# Patient Record
Sex: Female | Born: 1975
Health system: Southern US, Community
[De-identification: ages and names within clinical notes are randomized; demographics above are authoritative.]

## PROBLEM LIST (undated history)

## (undated) DIAGNOSIS — I10 Essential (primary) hypertension: Secondary | ICD-10-CM

## (undated) DIAGNOSIS — F329 Major depressive disorder, single episode, unspecified: Secondary | ICD-10-CM

## (undated) DIAGNOSIS — J4530 Mild persistent asthma, uncomplicated: Secondary | ICD-10-CM

## (undated) DIAGNOSIS — F32A Depression, unspecified: Secondary | ICD-10-CM

## (undated) DIAGNOSIS — F419 Anxiety disorder, unspecified: Secondary | ICD-10-CM

## (undated) DIAGNOSIS — Z8742 Personal history of other diseases of the female genital tract: Secondary | ICD-10-CM

## (undated) DIAGNOSIS — J309 Allergic rhinitis, unspecified: Secondary | ICD-10-CM

## (undated) HISTORY — DX: Allergic rhinitis, unspecified: J30.9

## (undated) HISTORY — PX: TONSILLECTOMY: SUR1361

## (undated) HISTORY — DX: Personal history of other diseases of the female genital tract: Z87.42

## (undated) HISTORY — PX: CERVICAL BIOPSY  W/ LOOP ELECTRODE EXCISION: SUR135

## (undated) HISTORY — DX: Mild persistent asthma, uncomplicated: J45.30

## (undated) HISTORY — DX: Depression, unspecified: F32.A

## (undated) HISTORY — PX: BREAST BIOPSY: SHX20

---
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
## 2009-12-06 DIAGNOSIS — F419 Anxiety disorder, unspecified: Secondary | ICD-10-CM | POA: Insufficient documentation

## 2009-12-06 DIAGNOSIS — F32A Depression, unspecified: Secondary | ICD-10-CM | POA: Insufficient documentation

## 2010-09-18 DIAGNOSIS — J4530 Mild persistent asthma, uncomplicated: Secondary | ICD-10-CM | POA: Insufficient documentation

## 2017-10-27 ENCOUNTER — Ambulatory Visit (HOSPITAL_COMMUNITY)
Admission: EM | Admit: 2017-10-27 | Discharge: 2017-10-27 | Disposition: A | Discharge status: Home / Self Care | Payer: Self-pay | Attending: Family Medicine | Admitting: Family Medicine

## 2017-10-27 ENCOUNTER — Encounter (HOSPITAL_COMMUNITY): Payer: Self-pay

## 2017-10-27 DIAGNOSIS — Z760 Encounter for issue of repeat prescription: Secondary | ICD-10-CM

## 2017-10-27 DIAGNOSIS — I10 Essential (primary) hypertension: Secondary | ICD-10-CM

## 2017-10-27 HISTORY — DX: Essential (primary) hypertension: I10

## 2017-10-27 HISTORY — DX: Anxiety disorder, unspecified: F41.9

## 2017-10-27 MED ORDER — HYDROCHLOROTHIAZIDE 25 MG PO TABS: 25.0000 mg | ORAL_TABLET | Freq: Every day | ORAL | 0 refills | Status: DC

## 2017-10-27 MED ORDER — AMLODIPINE BESYLATE 10 MG PO TABS: 10.0000 mg | ORAL_TABLET | Freq: Every day | ORAL | 0 refills | Status: DC

## 2017-10-27 NOTE — Discharge Instructions (Signed)
Follow up with new PCP.

## 2017-10-27 NOTE — ED Triage Notes (Signed)
Pt presents with need for refill on HCTZ 25 mg. Reports being out for two days. Reports having a mild headache. Pt is also on Amlodipine but is not out of that and has been talking it.

## 2017-10-27 NOTE — ED Provider Notes (Signed)
[facility]    CSN: [id number] Arrival date & time: 10/27/17  1139     History   Chief Complaint Chief Complaint  Patient presents with  . Medication Refill    HPI Sharon Krysiak is a 42 y.o. female.   HPI  Patient is in between jobs and in between primary care providers.  She is on amlodipine 10 mg a day and hydrochlorothiazide 25 mg a day for her blood pressure.  Her hydrochlorothiazide has run out and she needs a refill of this until she can get in with a new PCP.  Past Medical History:  Diagnosis Date  . Anxiety   . Hypertension     There are no active problems to display for this patient.   History reviewed. No pertinent surgical history.  OB History   None      Home Medications    Prior to Admission medications   Medication Sig Start Date End Date Taking? Authorizing Provider  amLODipine (NORVASC) 10 MG tablet Take 1 tablet (10 mg total) by mouth daily. 10/27/17   Raylene Everts, MD  hydrochlorothiazide (HYDRODIURIL) 25 MG tablet Take 1 tablet (25 mg total) by mouth daily. 10/27/17   Raylene Everts, MD    Family History Family History  Problem Relation Age of Onset  . Hypertension Mother   . Hypertension Father     Social History Social History   Tobacco Use  . Smoking status: Never Smoker  . Smokeless tobacco: Never Used  Substance Use Topics  . Alcohol use: Yes    Frequency: Never    Comment: occ  . Drug use: Never     Allergies   Patient has no known allergies.   Review of Systems Review of Systems  Constitutional: Negative for chills and fever.  HENT: Negative for ear pain and sore throat.   Eyes: Negative for pain and visual disturbance.  Respiratory: Negative for cough and shortness of breath.   Cardiovascular: Negative for chest pain and palpitations.  Gastrointestinal: Negative for abdominal pain and vomiting.  Genitourinary: Negative for dysuria and hematuria.  Musculoskeletal: Negative for arthralgias and  back pain.  Skin: Negative for color change and rash.  Neurological: Negative for seizures and syncope.  Psychiatric/Behavioral: Negative for dysphoric mood. The patient is not nervous/anxious.   All other systems reviewed and are negative.    Physical Exam Triage Vital Signs ED Triage Vitals  Enc Vitals Group     BP 10/27/17 1215 (!) 129/93     Pulse Rate 10/27/17 1215 95     Resp 10/27/17 1215 16     Temp 10/27/17 1215 98.7 F (37.1 C)     Temp Source 10/27/17 1215 Oral     SpO2 10/27/17 1215 96 %     Weight --      Height --      Head Circumference --      Peak Flow --      Pain Score 10/27/17 1217 2     Pain Loc --      Pain Edu? --      Excl. in [place]? --    No data found.  Updated Vital Signs BP (!) 129/93 (BP Location: Left Arm)   Pulse 95   Temp 98.7 F (37.1 C) (Oral)   Resp 16   LMP 10/27/2017   SpO2 96%       Physical Exam  Constitutional: She appears well-developed and well-nourished. No distress.  HENT:  Head: Normocephalic  and atraumatic.  Mouth/Throat: Oropharynx is clear and moist.  Eyes: Pupils are equal, round, and reactive to light. Conjunctivae are normal.  Neck: Normal range of motion.  Cardiovascular: Normal rate, regular rhythm and normal heart sounds.  Pulmonary/Chest: Effort normal and breath sounds normal. No respiratory distress.  Abdominal: Soft. She exhibits no distension.  Musculoskeletal: Normal range of motion. She exhibits no edema.  Neurological: She is alert.  Skin: Skin is warm and dry.  Psychiatric: She has a normal mood and affect. Her behavior is normal.     UC Treatments / Results  Labs (all labs ordered are listed, but only abnormal results are displayed) Labs Reviewed - No data to display  EKG None  Radiology No results found.  Procedures Procedures (including critical care time)  Medications Ordered in UC Medications - No data to display  Initial Impression / Assessment and Plan / UC Course  I have  reviewed the triage vital signs and the nursing notes.  Pertinent labs & imaging results that were available during my care of the patient were reviewed by me and considered in my medical decision making (see chart for details).      Final Clinical Impressions(s) / UC Diagnoses   Final diagnoses:  Medication refill  Essential hypertension     Discharge Instructions     Follow up with new PCP   ED Prescriptions    Medication Sig Dispense Auth. Provider   amLODipine (NORVASC) 10 MG tablet Take 1 tablet (10 mg total) by mouth daily. 90 tablet Raylene Everts, MD   hydrochlorothiazide (HYDRODIURIL) 25 MG tablet Take 1 tablet (25 mg total) by mouth daily. 90 tablet Raylene Everts, MD     Controlled Substance Prescriptions [place] Controlled Substance Registry consulted? Not Applicable   Raylene Everts, MD 10/27/17 1246

## 2018-08-17 ENCOUNTER — Encounter: Payer: Self-pay | Admitting: Family Medicine

## 2018-08-19 ENCOUNTER — Other Ambulatory Visit: Payer: Self-pay

## 2018-08-19 ENCOUNTER — Encounter: Payer: Self-pay | Admitting: Family Medicine

## 2018-08-19 ENCOUNTER — Ambulatory Visit (INDEPENDENT_AMBULATORY_CARE_PROVIDER_SITE_OTHER): Payer: No Typology Code available for payment source | Admitting: Family Medicine

## 2018-08-19 DIAGNOSIS — F321 Major depressive disorder, single episode, moderate: Secondary | ICD-10-CM | POA: Diagnosis not present

## 2018-08-19 DIAGNOSIS — I10 Essential (primary) hypertension: Secondary | ICD-10-CM | POA: Diagnosis not present

## 2018-08-19 DIAGNOSIS — N6310 Unspecified lump in the right breast, unspecified quadrant: Secondary | ICD-10-CM

## 2018-08-19 DIAGNOSIS — Z803 Family history of malignant neoplasm of breast: Secondary | ICD-10-CM

## 2018-08-19 MED ORDER — FLUOXETINE HCL 40 MG PO CAPS: 40.0000 mg | ORAL_CAPSULE | Freq: Every day | ORAL | 3 refills | Status: DC

## 2018-08-19 MED ORDER — AMLODIPINE BESYLATE 10 MG PO TABS: 10.0000 mg | ORAL_TABLET | Freq: Every day | ORAL | 0 refills | Status: DC

## 2018-08-19 MED ORDER — HYDROCHLOROTHIAZIDE 25 MG PO TABS: 25.0000 mg | ORAL_TABLET | Freq: Every day | ORAL | 0 refills | Status: DC

## 2018-08-19 NOTE — Progress Notes (Deleted)
Called patient to initiate their telephone visit with provider Molli Barrows, FNP-C. Verified date of birth. ***. Laurance Flatten, CMA.

## 2018-08-19 NOTE — Progress Notes (Signed)
Virtual Visit via Telephone Note  I connected with Natalie Guzman on 08/19/18 at  4:10 PM EDT by telephone and verified that I am speaking with the correct person using two identifiers.  Location: Patient: Located at home during today's encounter   Provider: Located at primary care office    I discussed the limitations, risks, security and privacy concerns of performing an evaluation and management service by telephone and the availability of in person appointments. I also discussed with the patient that there may be a patient responsible charge related to this service. The patient expressed understanding and agreed to proceed.   History of Present Illness: Natalie Guzman is establishing care today. Medical history includes history of a right benign breast lump, family history of breast cancer, and hypertension.  Patient reports over a year ago being diagnosed with hypertension.  Her blood pressure readings were.  Time remained difficult to control however her previous provider started her on amlodipine and titrated the dose and eventually added hydrochlorothiazide.  She reports monitoring her blood pressure routinely in of recent her blood pressure numbers have consistently ranged in the 025K systolically and 27C diastolically.  She has no history of any other underlying heart conditions.  Last wellness exam was August 2019.  She endorses routine physical activity.  Denies shortness of breath, swelling, chest pain, or headaches.  She is a non-smoker.  She is requesting medication refills today.  She is also requesting a refill of previously prescribed prescription for Prozac 40 mg.  She endorses recent stressors causing various mood swings.  Current stressors include work and routine stresses associated with life.Denies suicidal ideations, homicidal ideations, or auditory hallucinations.  She is also requesting a diagnostic mammogram as she reports she has a lump of her right breast that has been  chronically monitored over the last few years.  She attempted to schedule a diagnostic mammogram however was unable to.  She had her last screening mammogram last year and reports everything was normal.  She has a family history maternal and paternal side of the family of breast cancer.  Family History  Problem Relation Age of Onset  . Hypertension Mother   . Hypertension Father   . Breast cancer Paternal Aunt    Assessment and Plan: 1. Essential hypertension -Per home readings blood pressure appears to be well controlled.  Refill medications as requested.  Patient will follow-up in 8 weeks for a complete CPE including fasting labs and will recheck blood pressure in office at that time.  2. Current moderate episode of major depressive disorder without prior episode (HCC) -Resume Prozac 40 mg once daily -Encourage distractive activities and allowing time for self-care. -Encouraged exercise routinely with a goal of 150 minutes/week.  3. Right breast lump  History of biopsy with normal cytology -Need routine annual  surveillance   4. Family history of breast cancer -Routine annual screening mammogram   Follow Up Instructions: CPE 8 weeks with fasting labs and follow-up on depression   I discussed the assessment and treatment plan with the patient. The patient was provided an opportunity to ask questions and all were answered. The patient agreed with the plan and demonstrated an understanding of the instructions.   The patient was advised to call back or seek an in-person evaluation if the symptoms worsen or if the condition fails to improve as anticipated.  I provided 30 minutes of non-face-to-face time during this encounter.   Molli Barrows, FNP

## 2018-08-26 ENCOUNTER — Other Ambulatory Visit: Payer: Self-pay | Admitting: Family Medicine

## 2018-08-26 DIAGNOSIS — N6310 Unspecified lump in the right breast, unspecified quadrant: Secondary | ICD-10-CM

## 2018-09-03 ENCOUNTER — Ambulatory Visit: Payer: No Typology Code available for payment source

## 2018-09-03 ENCOUNTER — Ambulatory Visit
Admission: RE | Admit: 2018-09-03 | Discharge: 2018-09-03 | Disposition: A | Discharge status: Home / Self Care | Payer: No Typology Code available for payment source | Source: Ambulatory Visit | Attending: Family Medicine | Admitting: Family Medicine

## 2018-09-03 ENCOUNTER — Other Ambulatory Visit: Payer: Self-pay

## 2018-09-03 DIAGNOSIS — N6310 Unspecified lump in the right breast, unspecified quadrant: Secondary | ICD-10-CM

## 2018-10-22 ENCOUNTER — Ambulatory Visit: Payer: No Typology Code available for payment source | Admitting: Family Medicine

## 2018-10-23 ENCOUNTER — Ambulatory Visit: Payer: No Typology Code available for payment source | Admitting: Family Medicine

## 2019-03-30 ENCOUNTER — Encounter: Payer: Self-pay | Admitting: Family Medicine

## 2019-04-07 ENCOUNTER — Telehealth: Payer: Self-pay

## 2019-04-07 NOTE — Telephone Encounter (Signed)

## 2019-04-07 NOTE — Patient Instructions (Signed)

## 2019-04-08 ENCOUNTER — Other Ambulatory Visit: Payer: Self-pay

## 2019-04-08 ENCOUNTER — Ambulatory Visit (INDEPENDENT_AMBULATORY_CARE_PROVIDER_SITE_OTHER): Payer: No Typology Code available for payment source | Admitting: Internal Medicine

## 2019-04-08 ENCOUNTER — Encounter: Payer: Self-pay | Admitting: Internal Medicine

## 2019-04-08 VITALS — BP 133/89 | HR 78 | Temp 97.3°F | Resp 17 | Ht 61.0 in | Wt 150.0 lb

## 2019-04-08 DIAGNOSIS — Z0000 Encounter for general adult medical examination without abnormal findings: Secondary | ICD-10-CM | POA: Diagnosis not present

## 2019-04-08 DIAGNOSIS — I10 Essential (primary) hypertension: Secondary | ICD-10-CM | POA: Insufficient documentation

## 2019-04-08 DIAGNOSIS — Z114 Encounter for screening for human immunodeficiency virus [HIV]: Secondary | ICD-10-CM | POA: Diagnosis not present

## 2019-04-08 DIAGNOSIS — Z13228 Encounter for screening for other metabolic disorders: Secondary | ICD-10-CM

## 2019-04-08 DIAGNOSIS — Z9109 Other allergy status, other than to drugs and biological substances: Secondary | ICD-10-CM | POA: Insufficient documentation

## 2019-04-08 DIAGNOSIS — D259 Leiomyoma of uterus, unspecified: Secondary | ICD-10-CM | POA: Insufficient documentation

## 2019-04-08 MED ORDER — HYDROCHLOROTHIAZIDE 25 MG PO TABS: 25.0000 mg | ORAL_TABLET | Freq: Every day | ORAL | 1 refills | Status: DC

## 2019-04-08 MED ORDER — AMLODIPINE BESYLATE 10 MG PO TABS: 10.0000 mg | ORAL_TABLET | Freq: Every day | ORAL | 1 refills | Status: DC

## 2019-04-08 NOTE — Progress Notes (Signed)
  Subjective:    Natalie Guzman - 44 y.o. female MRN [id number]  Date of birth: 08/01/1975  HPI  Natalie Guzman is here for annual exam. Had PAP done on 03/22/19. Was normal but had ultrasound done due to heavy menstrual period in December (two in one month). Found out had fibroids. Was prescribed POP for symptoms. Had normal TSH. No lightheadedness or dizziness.   Reports will be due for mammogram in June 2021. Followed yearly. Has benign breast cysts.   Chronic HTN Disease Monitoring:  Home BP Monitoring - 120/70s at home  Chest pain- no  Dyspnea- no Headache - no  Medications: Amlodipine 10 mg, HCTZ 25 mg  Compliance- no Lightheadedness- no  Edema- no    Health Maintenance Due  Topic Date Due  . HIV Screening  10/23/1991    -  reports that she has never smoked. She has never used smokeless tobacco. - Review of Systems: Per HPI. - Past Medical History: There are no problems to display for this patient.  - Medications: reviewed and updated   Objective:   Physical Exam BP 133/89   Pulse 78   Temp (!) 97.3 F (36.3 C) (Temporal)   Resp 17   Ht 5\' 1"  (1.549 m)   Wt 150 lb (68 kg)   LMP 03/12/2019 (Exact Date)   SpO2 98%   BMI 28.34 kg/m  Physical Exam  Constitutional: She is oriented to person, place, and time and well-developed, well-nourished, and in no distress.  HENT:  Head: Normocephalic and atraumatic.  Mouth/Throat: Oropharynx is clear and moist.  Eyes: Pupils are equal, round, and reactive to light. Conjunctivae and EOM are normal.  Neck: No thyromegaly present.  Cardiovascular: Normal rate, regular rhythm, normal heart sounds and intact distal pulses.  No murmur heard. Pulmonary/Chest: Effort normal and breath sounds normal. No respiratory distress. She has no wheezes.  Abdominal: Soft. Bowel sounds are normal. She exhibits no distension. There is no abdominal tenderness. There is no rebound and no guarding.  Musculoskeletal:         General: No deformity or edema. Normal range of motion.     Cervical back: Normal range of motion and neck supple.  Lymphadenopathy:    She has no cervical adenopathy.  Neurological: She is alert and oriented to person, place, and time. Gait normal.  Skin: Skin is warm and dry. No rash noted. She is not diaphoretic.  Psychiatric: Mood, affect and judgment normal.           Assessment & Plan:   1. Annual physical exam Counseled on 150 minutes of exercise per week, healthy eating (including decreased daily intake of saturated fats, cholesterol, added sugars, sodium), STI prevention, routine healthcare maintenance. UTD on PAP and mammogram.   2. Screening for HIV (human immunodeficiency virus) - HIV Antibody (routine testing w rflx)  3. Essential hypertension Essentially at goal today, home numbers in range. Continue current therapy.  - amLODipine (NORVASC) 10 MG tablet; Take 1 tablet (10 mg total) by mouth daily.  Dispense: 90 tablet; Refill: 1 - hydrochlorothiazide (HYDRODIURIL) 25 MG tablet; Take 1 tablet (25 mg total) by mouth daily.  Dispense: 90 tablet; Refill: 1 - Comprehensive metabolic panel  4. Screening for metabolic disorder - Lipid panel    Phill Myron, D.O. 04/08/2019, 11:03 AM Primary Care at [facility]

## 2019-04-09 LAB — COMPREHENSIVE METABOLIC PANEL
ALT: 8 IU/L (ref 0–32)
AST: 15 IU/L (ref 0–40)
Albumin/Globulin Ratio: 1.4 (ref 1.2–2.2)
Albumin: 4.4 g/dL (ref 3.8–4.8)
Alkaline Phosphatase: 76 IU/L (ref 39–117)
BUN/Creatinine Ratio: 16 (ref 9–23)
BUN: 12 mg/dL (ref 6–24)
Bilirubin Total: 0.3 mg/dL (ref 0.0–1.2)
CO2: 23 mmol/L (ref 20–29)
Calcium: 9.3 mg/dL (ref 8.7–10.2)
Chloride: 99 mmol/L (ref 96–106)
Creatinine, Ser: 0.77 mg/dL (ref 0.57–1.00)
GFR calc Af Amer: 110 mL/min/{1.73_m2} (ref >59)
GFR calc non Af Amer: 96 mL/min/{1.73_m2} (ref >59)
Globulin, Total: 3.1 g/dL (ref 1.5–4.5)
Glucose: 80 mg/dL (ref 65–99)
Potassium: 3.8 mmol/L (ref 3.5–5.2)
Sodium: 138 mmol/L (ref 134–144)
Total Protein: 7.5 g/dL (ref 6.0–8.5)

## 2019-04-09 LAB — LIPID PANEL
Chol/HDL Ratio: 3.1 ratio (ref 0.0–4.4)
Cholesterol, Total: 171 mg/dL (ref 100–199)
HDL: 55 mg/dL (ref >39)
LDL Chol Calc (NIH): 103 mg/dL — ABNORMAL HIGH (ref 0–99)
Triglycerides: 69 mg/dL (ref 0–149)
VLDL Cholesterol Cal: 13 mg/dL (ref 5–40)

## 2019-04-09 LAB — HIV ANTIBODY (ROUTINE TESTING W REFLEX): HIV Screen 4th Generation wRfx: NONREACTIVE

## 2019-04-09 NOTE — Progress Notes (Signed)
Patient notified of results & recommendations. Expressed understanding.

## 2019-08-07 ENCOUNTER — Other Ambulatory Visit: Payer: Self-pay | Admitting: Family Medicine

## 2019-08-07 NOTE — Telephone Encounter (Signed)
Please let Dr. Juleen China know about this prescription

## 2019-10-05 ENCOUNTER — Other Ambulatory Visit: Payer: Self-pay | Admitting: Internal Medicine

## 2019-10-05 DIAGNOSIS — Z1231 Encounter for screening mammogram for malignant neoplasm of breast: Secondary | ICD-10-CM

## 2019-10-07 ENCOUNTER — Other Ambulatory Visit: Payer: Self-pay

## 2019-10-07 ENCOUNTER — Ambulatory Visit
Admission: RE | Admit: 2019-10-07 | Discharge: 2019-10-07 | Disposition: A | Discharge status: Home / Self Care | Payer: No Typology Code available for payment source | Source: Ambulatory Visit | Attending: Internal Medicine | Admitting: Internal Medicine

## 2019-10-07 DIAGNOSIS — Z1231 Encounter for screening mammogram for malignant neoplasm of breast: Secondary | ICD-10-CM

## 2019-10-13 ENCOUNTER — Other Ambulatory Visit: Payer: Self-pay

## 2019-10-13 DIAGNOSIS — I10 Essential (primary) hypertension: Secondary | ICD-10-CM

## 2019-10-13 MED ORDER — HYDROCHLOROTHIAZIDE 25 MG PO TABS: 25.0000 mg | ORAL_TABLET | Freq: Every day | ORAL | 0 refills | Status: DC

## 2019-10-13 MED ORDER — AMLODIPINE BESYLATE 10 MG PO TABS: 10.0000 mg | ORAL_TABLET | Freq: Every day | ORAL | 0 refills | Status: DC

## 2019-10-22 ENCOUNTER — Encounter: Payer: Self-pay | Admitting: Internal Medicine

## 2019-10-22 ENCOUNTER — Telehealth (INDEPENDENT_AMBULATORY_CARE_PROVIDER_SITE_OTHER): Payer: No Typology Code available for payment source | Admitting: Internal Medicine

## 2019-10-22 DIAGNOSIS — I10 Essential (primary) hypertension: Secondary | ICD-10-CM | POA: Diagnosis not present

## 2019-10-22 DIAGNOSIS — N946 Dysmenorrhea, unspecified: Secondary | ICD-10-CM | POA: Diagnosis not present

## 2019-10-22 MED ORDER — CYCLOBENZAPRINE HCL 5 MG PO TABS: 5.0000 mg | ORAL_TABLET | Freq: Three times a day (TID) | ORAL | 1 refills | Status: DC | PRN

## 2019-10-22 NOTE — Progress Notes (Signed)
Virtual Visit via Telephone Note  I connected with Natalie Guzman, on 10/22/2019 at 8:55 AM by telephone due to the COVID-19 pandemic and verified that I am speaking with the correct person using two identifiers.   Consent: I discussed the limitations, risks, security and privacy concerns of performing an evaluation and management service by telephone and the availability of in person appointments. I also discussed with the patient that there may be a patient responsible charge related to this service. The patient expressed understanding and agreed to proceed.   Location of Patient: Home   Location of Provider: Clinic    Persons participating in Telemedicine visit: Earline Peplinski Heide Guile Dr. Juleen China   History of Present Illness: Patient has a visit to f/u HTN.   Chronic HTN Disease Monitoring:  Home BP Monitoring - 120/80s  Chest pain- no  Dyspnea- no Headache - no  Medications: Amlodipine 10 mg, HCTZ 25 mg  Compliance- yes Lightheadedness- no  Edema- no  Really watches her Na intake a lot.      Past Medical History:  Diagnosis Date  . Allergic rhinitis   . Anxiety   . Depression   . H/O abnormal cervical Papanicolaou smear   . Hypertension   . Mild persistent asthma    Allergies  Allergen Reactions  . Ibuprofen Shortness Of Breath  . Avocado Itching and Swelling    Current Outpatient Medications on File Prior to Visit  Medication Sig Dispense Refill  . amLODipine (NORVASC) 10 MG tablet Take 1 tablet (10 mg total) by mouth daily. 90 tablet 0  . b complex-C-folic acid 1 MG capsule Take 1 capsule by mouth daily.    . Cholecalciferol (VITAMIN D) 50 MCG (2000 UT) CAPS Take 1 capsule by mouth daily.    Marland Kitchen FLUoxetine (PROZAC) 40 MG capsule Take 1 capsule by mouth once daily 90 capsule 0  . hydrochlorothiazide (HYDRODIURIL) 25 MG tablet Take 1 tablet (25 mg total) by mouth daily. 90 tablet 0   No current facility-administered medications on  file prior to visit.    Observations/Objective: NAD. Speaking clearly.  Work of breathing normal.  Alert and oriented. Mood appropriate.   Assessment and Plan: 1. Essential hypertension BP is at goal. Continue current regimen.  Counseled on blood pressure goal of less than 130/80, low-sodium, DASH diet, medication compliance, 150 minutes of moderate intensity exercise per week. Discussed medication compliance, adverse effects.  2. Dysmenorrhea Patient reports concerns about painful, regular menses. She is not interested in hormonal options, including OCPs and IUDs, for management of menses. She is unable to take Ibuprofen due to allergy and Tylenol is not controlling the pain. We discussed trial of muscle relaxer but I advised there is not data behind its use for dysmenorrhea. She is agreeable to try. Supportive care measures such as heat and exercise to reduce pain discussed.  - cyclobenzaprine (FLEXERIL) 5 MG tablet; Take 1 tablet (5 mg total) by mouth 3 (three) times daily as needed for muscle spasms.  Dispense: 30 tablet; Refill: 1   Follow Up Instructions: PRN and for routine medical care    I discussed the assessment and treatment plan with the patient. The patient was provided an opportunity to ask questions and all were answered. The patient agreed with the plan and demonstrated an understanding of the instructions.   The patient was advised to call back or seek an in-person evaluation if the symptoms worsen or if the condition fails to improve as anticipated.  I provided 22 minutes total of non-face-to-face time during this encounter including median intraservice time, reviewing previous notes, investigations, ordering medications, medical decision making, coordinating care and patient verbalized understanding at the end of the visit.    Phill Myron, D.O. Primary Care at [facility]  10/22/2019, 8:55 AM

## 2019-11-05 ENCOUNTER — Other Ambulatory Visit: Payer: Self-pay

## 2019-11-05 MED ORDER — FLUOXETINE HCL 40 MG PO CAPS: 40.0000 mg | ORAL_CAPSULE | Freq: Every day | ORAL | 1 refills | Status: DC

## 2020-01-11 ENCOUNTER — Other Ambulatory Visit: Payer: Self-pay

## 2020-01-11 DIAGNOSIS — I10 Essential (primary) hypertension: Secondary | ICD-10-CM

## 2020-01-11 MED ORDER — AMLODIPINE BESYLATE 10 MG PO TABS: 10.0000 mg | ORAL_TABLET | Freq: Every day | ORAL | 0 refills | Status: DC

## 2020-01-11 MED ORDER — HYDROCHLOROTHIAZIDE 25 MG PO TABS: 25.0000 mg | ORAL_TABLET | Freq: Every day | ORAL | 0 refills | Status: DC

## 2020-03-04 DIAGNOSIS — D259 Leiomyoma of uterus, unspecified: Secondary | ICD-10-CM

## 2020-03-04 HISTORY — DX: Leiomyoma of uterus, unspecified: D25.9

## 2020-04-08 ENCOUNTER — Other Ambulatory Visit: Payer: Self-pay | Admitting: Internal Medicine

## 2020-04-08 DIAGNOSIS — I10 Essential (primary) hypertension: Secondary | ICD-10-CM

## 2020-04-09 ENCOUNTER — Other Ambulatory Visit: Payer: Self-pay

## 2020-04-09 DIAGNOSIS — I10 Essential (primary) hypertension: Secondary | ICD-10-CM

## 2020-04-10 ENCOUNTER — Other Ambulatory Visit: Payer: Self-pay

## 2020-04-10 DIAGNOSIS — I10 Essential (primary) hypertension: Secondary | ICD-10-CM

## 2020-04-10 MED ORDER — AMLODIPINE BESYLATE 10 MG PO TABS: 10.0000 mg | ORAL_TABLET | Freq: Every day | ORAL | 0 refills | Status: DC

## 2020-05-04 ENCOUNTER — Other Ambulatory Visit: Payer: Self-pay | Admitting: Internal Medicine

## 2020-05-11 ENCOUNTER — Telehealth: Payer: Self-pay | Admitting: Internal Medicine

## 2020-05-11 ENCOUNTER — Other Ambulatory Visit: Payer: Self-pay | Admitting: Internal Medicine

## 2020-05-11 NOTE — Telephone Encounter (Signed)
Patient needs a f/u appt.  Patient aware and call transferred to schedule appt

## 2020-05-11 NOTE — Telephone Encounter (Signed)
Pt called requesting chart noted to have ALL meds written for 90 days supply.

## 2020-05-12 ENCOUNTER — Ambulatory Visit (INDEPENDENT_AMBULATORY_CARE_PROVIDER_SITE_OTHER): Payer: Self-pay | Admitting: Internal Medicine

## 2020-05-12 ENCOUNTER — Encounter: Payer: Self-pay | Admitting: Internal Medicine

## 2020-05-12 ENCOUNTER — Other Ambulatory Visit: Payer: Self-pay

## 2020-05-12 VITALS — BP 122/84 | HR 81 | Temp 97.3°F | Resp 20 | Wt 148.0 lb

## 2020-05-12 DIAGNOSIS — F32A Depression, unspecified: Secondary | ICD-10-CM

## 2020-05-12 DIAGNOSIS — F410 Panic disorder [episodic paroxysmal anxiety] without agoraphobia: Secondary | ICD-10-CM

## 2020-05-12 DIAGNOSIS — Z1159 Encounter for screening for other viral diseases: Secondary | ICD-10-CM

## 2020-05-12 DIAGNOSIS — I10 Essential (primary) hypertension: Secondary | ICD-10-CM

## 2020-05-12 MED ORDER — AMLODIPINE BESYLATE 10 MG PO TABS: 10.0000 mg | ORAL_TABLET | Freq: Every day | ORAL | 1 refills | Status: DC

## 2020-05-12 MED ORDER — HYDROXYZINE HCL 10 MG PO TABS: 10.0000 mg | ORAL_TABLET | Freq: Three times a day (TID) | ORAL | 1 refills | Status: DC | PRN

## 2020-05-12 MED ORDER — HYDROCHLOROTHIAZIDE 25 MG PO TABS: 25.0000 mg | ORAL_TABLET | Freq: Every day | ORAL | 1 refills | Status: DC

## 2020-05-12 MED ORDER — FLUOXETINE HCL 40 MG PO CAPS: 40.0000 mg | ORAL_CAPSULE | Freq: Every day | ORAL | 1 refills | Status: DC

## 2020-05-12 NOTE — Progress Notes (Signed)
Medication refill

## 2020-05-12 NOTE — Progress Notes (Signed)
Subjective:    Natalie Guzman - 44 y.o. female MRN [id number]  Date of birth: 12/19/1975  HPI  Natalie Guzman is here for follow up of chronic medical conditions. Reports she stopped monitoring BP at home since numbers were always well controlled. Takes medications daily.   Does have some concerns about new onset panic attacks. Has been compliant with Prozac. However, her son is in the [employer] and she is concerned about him getting deployed with recent world events. Has been unable to watch the news because it makes her so anxious.   Depression screen [facility] 2/9 05/12/2020 04/08/2019 08/19/2018  Decreased Interest 1 1 1   Down, Depressed, Hopeless 1 0 1  PHQ - 2 Score 2 1 2   Altered sleeping 1 0 0  Tired, decreased energy 0 1 0  Change in appetite 0 0 0  Feeling bad or failure about yourself  1 0 1  Trouble concentrating 0 1 -  Moving slowly or fidgety/restless 0 0 0  Suicidal thoughts 0 0 0  PHQ-9 Score 4 3 3    GAD 7 : Generalized Anxiety Score 05/12/2020 04/08/2019 08/19/2018  Nervous, Anxious, on Edge 1 1 1   Control/stop worrying 1 1 1   Worry too much - different things 1 1 1   Trouble relaxing 1 0 1  Restless 1 1 0  Easily annoyed or irritable 1 1 0  Afraid - awful might happen 1 1 1   Total GAD 7 Score 7 6 5       Health Maintenance:  Health Maintenance Due  Topic Date Due  . Hepatitis C Screening  Never done  . TETANUS/TDAP  12/07/2019  . COVID-19 Vaccine (3 - Booster for Moderna series) 12/19/2019    -  reports that she has never smoked. She has never used smokeless tobacco. - Review of Systems: Per HPI. - Past Medical History: Patient Active Problem List   Diagnosis Date Noted  . Environmental allergies 04/08/2019  . Essential hypertension 04/08/2019  . Fibroid uterus 04/08/2019  . Asthma, mild persistent 09/18/2010  . Depression 12/06/2009   - Medications: reviewed and updated   Objective:   Physical Exam BP 122/84   Pulse 81   Temp (!) 97.3 F (36.3 C)   Resp  20   Wt 148 lb (67.1 kg)   LMP 04/24/2020 (Exact Date)   SpO2 97%   BMI 27.96 kg/m  Physical Exam Constitutional:      General: She is not in acute distress.    Appearance: She is not diaphoretic.  Cardiovascular:     Rate and Rhythm: Normal rate.  Pulmonary:     Effort: Pulmonary effort is normal. No respiratory distress.  Musculoskeletal:        General: Normal range of motion.  Skin:    General: Skin is warm and dry.  Neurological:     Mental Status: She is alert and oriented to person, place, and time.  Psychiatric:        Mood and Affect: Affect normal.        Judgment: Judgment normal.            Assessment & Plan:   1. Essential hypertension BP well controlled. Asymptomatic. Continue current regimen.  - amLODipine (NORVASC) 10 MG tablet; Take 1 tablet (10 mg total) by mouth daily.  Dispense: 90 tablet; Refill: 1 - hydrochlorothiazide (HYDRODIURIL) 25 MG tablet; Take 1 tablet (25 mg total) by mouth daily.  Dispense: 90 tablet; Refill: 1 - Comprehensive metabolic panel - Lipid panel  2. Depression, unspecified depression type PHQ-9 well controlled. Continue prozac.  - FLUoxetine (PROZAC) 40 MG capsule; Take 1 capsule (40 mg total) by mouth daily.  Dispense: 90 capsule; Refill: 1  3. Panic attacks Situational related to stress about family member.  - hydrOXYzine (ATARAX/VISTARIL) 10 MG tablet; Take 1 tablet (10 mg total) by mouth 3 (three) times daily as needed for anxiety.  Dispense: 30 tablet; Refill: 1  4. Need for hepatitis C screening test - Hepatitis C antibody    Phill Myron, D.O. 05/12/2020, 10:01 AM Primary Care at [facility]

## 2020-05-13 LAB — COMPREHENSIVE METABOLIC PANEL
ALT: 8 IU/L (ref 0–32)
AST: 13 IU/L (ref 0–40)
Albumin/Globulin Ratio: 1.6 (ref 1.2–2.2)
Albumin: 4.9 g/dL — ABNORMAL HIGH (ref 3.8–4.8)
Alkaline Phosphatase: 94 IU/L (ref 44–121)
BUN/Creatinine Ratio: 17 (ref 9–23)
BUN: 12 mg/dL (ref 6–24)
Bilirubin Total: 0.3 mg/dL (ref 0.0–1.2)
CO2: 21 mmol/L (ref 20–29)
Calcium: 9.8 mg/dL (ref 8.7–10.2)
Chloride: 101 mmol/L (ref 96–106)
Creatinine, Ser: 0.71 mg/dL (ref 0.57–1.00)
Globulin, Total: 3 g/dL (ref 1.5–4.5)
Glucose: 93 mg/dL (ref 65–99)
Potassium: 4 mmol/L (ref 3.5–5.2)
Sodium: 139 mmol/L (ref 134–144)
Total Protein: 7.9 g/dL (ref 6.0–8.5)
eGFR: 108 mL/min/{1.73_m2} (ref >59)

## 2020-05-13 LAB — LIPID PANEL
Chol/HDL Ratio: 3.4 ratio (ref 0.0–4.4)
Cholesterol, Total: 195 mg/dL (ref 100–199)
HDL: 57 mg/dL (ref >39)
LDL Chol Calc (NIH): 125 mg/dL — ABNORMAL HIGH (ref 0–99)
Triglycerides: 70 mg/dL (ref 0–149)
VLDL Cholesterol Cal: 13 mg/dL (ref 5–40)

## 2020-05-13 LAB — HEPATITIS C ANTIBODY: Hep C Virus Ab: <0.1 s/co ratio (ref 0.0–0.9)

## 2020-05-18 ENCOUNTER — Ambulatory Visit: Payer: No Typology Code available for payment source | Admitting: Licensed Clinical Social Worker

## 2020-11-07 ENCOUNTER — Encounter: Payer: Self-pay | Admitting: Family

## 2020-11-09 ENCOUNTER — Other Ambulatory Visit: Payer: Self-pay

## 2020-11-09 ENCOUNTER — Ambulatory Visit (INDEPENDENT_AMBULATORY_CARE_PROVIDER_SITE_OTHER): Payer: Self-pay | Admitting: Nurse Practitioner

## 2020-11-09 ENCOUNTER — Encounter: Payer: Self-pay | Admitting: Nurse Practitioner

## 2020-11-09 DIAGNOSIS — I10 Essential (primary) hypertension: Secondary | ICD-10-CM

## 2020-11-09 DIAGNOSIS — F32A Depression, unspecified: Secondary | ICD-10-CM

## 2020-11-09 MED ORDER — FLUOXETINE HCL 40 MG PO CAPS: 40.0000 mg | ORAL_CAPSULE | Freq: Every day | ORAL | 0 refills | Status: DC

## 2020-11-09 MED ORDER — HYDROCHLOROTHIAZIDE 25 MG PO TABS: 25.0000 mg | ORAL_TABLET | Freq: Every day | ORAL | 0 refills | Status: DC

## 2020-11-09 NOTE — Progress Notes (Signed)
Virtual Visit via Telephone Note  I connected with Natalie Guzman on 11/09/20 at 10:50 AM EDT by telephone and verified that I am speaking with the correct person using two identifiers.  Location: Patient: home Provider: office   I discussed the limitations, risks, security and privacy concerns of performing an evaluation and management service by telephone and the availability of in person appointments. I also discussed with the patient that there may be a patient responsible charge related to this service. The patient expressed understanding and agreed to proceed.   History of Present Illness:  Patient presents today for medication refill through televisit.  This is a former patient of Dr. Juleen China.  Patient does have a history of hypertension and states that she is going to run out of her HCTZ soon and has also ran out of her Prozac for history of anxiety.  Patient states that her blood pressures have been running within normal range.  She states that her anxiety is well controlled with Prozac.  She is also taking amlodipine and states that she does have a good supply of this medication.  Patient does have an upcoming appointment scheduled to establish care with a new provider at the end of this month. Denies f/c/s, n/v/d, hemoptysis, PND, chest pain or edema.     Observations/Objective:  Vitals with BMI 05/12/2020 04/08/2019 10/27/2017  Height - '5\' 1"'$  -  Weight 148 lbs 150 lbs -  BMI - 123456 -  Systolic [id number] [id number]  Diastolic 84 89 93  Pulse 81 78 95      Assessment and Plan:  Hypertension:  Will reorder HCTZ  Continue to check blood pressure at home  Stay active  Low sodium diet  Anxiety:  Will refill Prozac  Follow up:  Follow up as scheduled to establish care with new provider    I discussed the assessment and treatment plan with the patient. The patient was provided an opportunity to ask questions and all were answered. The patient agreed with the plan and  demonstrated an understanding of the instructions.   The patient was advised to call back or seek an in-person evaluation if the symptoms worsen or if the condition fails to improve as anticipated.  I provided 23 minutes of non-face-to-face time during this encounter.   Fenton Foy, NP

## 2020-11-09 NOTE — Patient Instructions (Addendum)
Hypertension:  Will reorder HCTZ  Continue to check blood pressure at home  Stay active  Low sodium diet  Anxiety:  Will refill Prozac  Follow up:  Follow up as scheduled to establish care with new provider

## 2020-11-13 ENCOUNTER — Ambulatory Visit: Payer: No Typology Code available for payment source | Admitting: Internal Medicine

## 2020-11-16 NOTE — Progress Notes (Signed)
Erroneous encounter

## 2020-11-20 ENCOUNTER — Other Ambulatory Visit: Payer: Self-pay

## 2020-11-20 ENCOUNTER — Encounter: Payer: Self-pay | Admitting: Family

## 2020-11-20 DIAGNOSIS — F32A Depression, unspecified: Secondary | ICD-10-CM

## 2020-11-20 DIAGNOSIS — I10 Essential (primary) hypertension: Secondary | ICD-10-CM

## 2020-12-01 ENCOUNTER — Other Ambulatory Visit: Payer: Self-pay | Admitting: Nurse Practitioner

## 2020-12-01 DIAGNOSIS — F32A Depression, unspecified: Secondary | ICD-10-CM

## 2020-12-01 DIAGNOSIS — I10 Essential (primary) hypertension: Secondary | ICD-10-CM

## 2020-12-07 ENCOUNTER — Ambulatory Visit: Payer: Self-pay | Admitting: Family Medicine

## 2020-12-07 ENCOUNTER — Other Ambulatory Visit: Payer: Self-pay | Admitting: Family

## 2020-12-07 DIAGNOSIS — I10 Essential (primary) hypertension: Secondary | ICD-10-CM

## 2020-12-07 MED ORDER — HYDROCHLOROTHIAZIDE 25 MG PO TABS: 25.0000 mg | ORAL_TABLET | Freq: Every day | ORAL | 0 refills | Status: DC

## 2020-12-07 MED ORDER — AMLODIPINE BESYLATE 10 MG PO TABS: 10.0000 mg | ORAL_TABLET | Freq: Every day | ORAL | 0 refills | Status: DC

## 2020-12-07 NOTE — Progress Notes (Signed)
Per patient request Amlodipine (Norvasc) and Hydrochlorothiazide (Hydrodiuril) refilled for 90 day supply. Patient encouraged to schedule appointment with Dorna Mai, MD for additional refills.

## 2020-12-19 ENCOUNTER — Other Ambulatory Visit: Payer: Self-pay | Admitting: *Deleted

## 2020-12-19 DIAGNOSIS — F32A Depression, unspecified: Secondary | ICD-10-CM

## 2020-12-19 MED ORDER — FLUOXETINE HCL 40 MG PO CAPS: 40.0000 mg | ORAL_CAPSULE | Freq: Every day | ORAL | 0 refills | Status: DC

## 2021-01-18 ENCOUNTER — Ambulatory Visit: Payer: BC Managed Care – PPO | Admitting: Physician Assistant

## 2021-01-18 ENCOUNTER — Encounter: Payer: Self-pay | Admitting: Physician Assistant

## 2021-01-18 ENCOUNTER — Other Ambulatory Visit: Payer: Self-pay

## 2021-01-18 VITALS — BP 119/77 | HR 68 | Temp 98.2°F | Ht 61.0 in | Wt 150.2 lb

## 2021-01-18 DIAGNOSIS — Z23 Encounter for immunization: Secondary | ICD-10-CM | POA: Diagnosis not present

## 2021-01-18 DIAGNOSIS — Z803 Family history of malignant neoplasm of breast: Secondary | ICD-10-CM

## 2021-01-18 DIAGNOSIS — F419 Anxiety disorder, unspecified: Secondary | ICD-10-CM | POA: Diagnosis not present

## 2021-01-18 DIAGNOSIS — F32A Depression, unspecified: Secondary | ICD-10-CM

## 2021-01-18 DIAGNOSIS — Z0000 Encounter for general adult medical examination without abnormal findings: Secondary | ICD-10-CM

## 2021-01-18 DIAGNOSIS — Z131 Encounter for screening for diabetes mellitus: Secondary | ICD-10-CM

## 2021-01-18 DIAGNOSIS — I10 Essential (primary) hypertension: Secondary | ICD-10-CM

## 2021-01-18 DIAGNOSIS — D259 Leiomyoma of uterus, unspecified: Secondary | ICD-10-CM | POA: Diagnosis not present

## 2021-01-18 DIAGNOSIS — Z13220 Encounter for screening for lipoid disorders: Secondary | ICD-10-CM | POA: Diagnosis not present

## 2021-01-18 LAB — CBC WITH DIFFERENTIAL/PLATELET
Basophils Absolute: 0 10*3/uL (ref 0.0–0.1)
Basophils Relative: 0.8 % (ref 0.0–3.0)
Eosinophils Absolute: 0.2 10*3/uL (ref 0.0–0.7)
Eosinophils Relative: 3.4 % (ref 0.0–5.0)
HCT: 38.3 % (ref 36.0–46.0)
Hemoglobin: 12.3 g/dL (ref 12.0–15.0)
Lymphocytes Relative: 29 % (ref 12.0–46.0)
Lymphs Abs: 1.5 10*3/uL (ref 0.7–4.0)
MCHC: 32 g/dL (ref 30.0–36.0)
MCV: 85.6 fl (ref 78.0–100.0)
Monocytes Absolute: 0.6 10*3/uL (ref 0.1–1.0)
Monocytes Relative: 11.3 % (ref 3.0–12.0)
Neutro Abs: 2.8 10*3/uL (ref 1.4–7.7)
Neutrophils Relative %: 55.5 % (ref 43.0–77.0)
Platelets: 334 10*3/uL (ref 150.0–400.0)
RBC: 4.48 Mil/uL (ref 3.87–5.11)
RDW: 14.6 % (ref 11.5–15.5)
WBC: 5.1 10*3/uL (ref 4.0–10.5)

## 2021-01-18 LAB — COMPREHENSIVE METABOLIC PANEL
ALT: 11 U/L (ref 0–35)
AST: 21 U/L (ref 0–37)
Albumin: 4.4 g/dL (ref 3.5–5.2)
Alkaline Phosphatase: 76 U/L (ref 39–117)
BUN: 16 mg/dL (ref 6–23)
CO2: 26 mEq/L (ref 19–32)
Calcium: 9.5 mg/dL (ref 8.4–10.5)
Chloride: 101 mEq/L (ref 96–112)
Creatinine, Ser: 0.68 mg/dL (ref 0.40–1.20)
GFR: 106.06 mL/min (ref >60.00)
Glucose, Bld: 92 mg/dL (ref 70–99)
Potassium: 3.4 mEq/L — ABNORMAL LOW (ref 3.5–5.1)
Sodium: 136 mEq/L (ref 135–145)
Total Bilirubin: 0.4 mg/dL (ref 0.2–1.2)
Total Protein: 7.8 g/dL (ref 6.0–8.3)

## 2021-01-18 LAB — T4, FREE: Free T4: 0.8 ng/dL (ref 0.60–1.60)

## 2021-01-18 LAB — LIPID PANEL
Cholesterol: 188 mg/dL (ref 0–200)
HDL: 57.7 mg/dL (ref >39.00)
LDL Cholesterol: 119 mg/dL — ABNORMAL HIGH (ref 0–99)
NonHDL: 129.98
Total CHOL/HDL Ratio: 3
Triglycerides: 55 mg/dL (ref 0.0–149.0)
VLDL: 11 mg/dL (ref 0.0–40.0)

## 2021-01-18 LAB — TSH: TSH: 1.06 u[IU]/mL (ref 0.35–5.50)

## 2021-01-18 NOTE — Patient Instructions (Signed)
Good to meet you today! Please go to the lab for blood work and I will send results through [place]. Referral sent to genetics division. Flu shot today  Keep up good work with healthy lifestyle changes! Work on increasing cardio during the week.

## 2021-01-18 NOTE — Progress Notes (Signed)
Subjective:    Patient ID: Natalie Guzman, female    DOB: [birth year], 45 y.o.   MRN: [id number]  Chief Complaint  Patient presents with   Annual exam, new patient.     45 y.o. patient presents today for new patient establishment with me.  Patient was previously established with Dr. Juleen China.  Current Care Team: -GYN - Dr. Melba Coon  Acute Concerns: Pain from fibroids -  - adenomyosis & fibroids - scheduling for hysterectomy in Feb 2023; mammogram updated as well  Chronic Concerns: See PMH listed below, as well as A/P for details on issues we specifically discussed during today's visit.    Health maintenance: Lifestyle/ exercise: Walks twice daily with her dog Nutrition: Cooking at home Mental health: Doing well with Prozac and counseling. Most stress from her son who is in the [place] (7 years) - GF due with her 1st grandchild next year Sleep: Occasionally trouble falling asleep and will take Melatonin prn Sexual activity: Monogamous with husband  Immunizations: Flu shot today, will postpone Tdap to next year  Colonoscopy: Next year at age 81 Pap: UTD  Mammogram: UTD; breast cancer on both sides of her family    Past Medical History:  Diagnosis Date   Allergic rhinitis    Anxiety    Depression    H/O abnormal cervical Papanicolaou smear    Hypertension    Mild persistent asthma     Past Surgical History:  Procedure Laterality Date   BREAST BIOPSY Right    TONSILLECTOMY      Family History  Problem Relation Age of Onset   Hypertension Mother    Hypertension Father    Cerebral aneurysm Sister    Breast cancer Maternal Grandmother    Breast cancer Paternal Grandmother    Breast cancer Maternal Aunt    Breast cancer Paternal Aunt     Social History   Tobacco Use   Smoking status: Never   Smokeless tobacco: Never  Substance Use Topics   Alcohol use: Yes    Comment: occ   Drug use: Never     Allergies  Allergen Reactions   Ibuprofen Shortness Of Breath    Avocado Itching and Swelling    Review of Systems NEGATIVE UNLESS OTHERWISE INDICATED IN HPI      Objective:     BP 119/77   Pulse 68   Temp 98.2 F (36.8 C)   Ht 5' 1"  (1.549 m)   Wt 150 lb 4 oz (68.2 kg)   SpO2 98%   BMI 28.39 kg/m   Wt Readings from Last 3 Encounters:  01/18/21 150 lb 4 oz (68.2 kg)  11/20/20 140 lb (63.5 kg)  05/12/20 148 lb (67.1 kg)    BP Readings from Last 3 Encounters:  01/18/21 119/77  11/20/20 123/71  05/12/20 122/84     Physical Exam Vitals and nursing note reviewed.  Constitutional:      Appearance: Normal appearance. She is normal weight. She is not toxic-appearing.  HENT:     Head: Normocephalic and atraumatic.     Right Ear: Tympanic membrane, ear canal and external ear normal.     Left Ear: Tympanic membrane, ear canal and external ear normal.     Nose: Nose normal.     Mouth/Throat:     Mouth: Mucous membranes are moist.  Eyes:     Extraocular Movements: Extraocular movements intact.     Conjunctiva/sclera: Conjunctivae normal.     Pupils: Pupils are equal, round, and reactive to  light.  Cardiovascular:     Rate and Rhythm: Normal rate and regular rhythm.     Pulses: Normal pulses.     Heart sounds: Normal heart sounds.  Pulmonary:     Effort: Pulmonary effort is normal.     Breath sounds: Normal breath sounds.  Abdominal:     General: Abdomen is flat. Bowel sounds are normal.     Palpations: Abdomen is soft.  Musculoskeletal:        General: Normal range of motion.     Cervical back: Normal range of motion and neck supple.  Skin:    General: Skin is warm and dry.  Neurological:     General: No focal deficit present.     Mental Status: She is alert and oriented to person, place, and time.  Psychiatric:        Mood and Affect: Mood normal.        Behavior: Behavior normal.        Thought Content: Thought content normal.        Judgment: Judgment normal.       Assessment & Plan:   Problem List Items  Addressed This Visit       Cardiovascular and Mediastinum   Essential hypertension   Relevant Orders   Comprehensive metabolic panel     Genitourinary   Fibroid uterus   Relevant Orders   CBC with Differential/Platelet   Other Visit Diagnoses     Encounter for annual physical exam    -  Primary   Relevant Orders   CBC with Differential/Platelet   Comprehensive metabolic panel   Lipid panel   T4, free   TSH   Anxiety and depression       Relevant Orders   T4, free   TSH   Family history of breast cancer       Relevant Orders   Ambulatory referral to Genetics   Diabetes mellitus screening       Relevant Orders   Comprehensive metabolic panel   Screening for cholesterol level       Relevant Orders   Lipid panel      1. Encounter for annual physical exam Age-appropriate screening and counseling performed today. Will check labs and call with results. Preventive measures discussed and printed in AVS for patient. She is doing very well working on her health goals. Flu shot updated.   2. Essential hypertension Her blood pressure is currently to goal on amlodipine 10 mg and hydrochlorothiazide 25 mg daily.  She is working on increasing cardio exercise into her routine.  She is doing well with walking.  She has a low-salt diet. Not due for refills.   3. Anxiety and depression -She is doing very well and stable with Prozac 40 mg daily.  She does get tearful when discussing the recent passing of her sister in July this last year from a brain aneurysm.  She is still in counseling.  4. Family history of breast cancer Both grandmothers and aunts on both sides of her family have had breast cancer.  Patient is up-to-date with her mammogram.  She would like to discuss with genetics if possible.  She has never had BRCA 1 or 2 testing.  Referral sent today.  5. Uterine leiomyoma, unspecified location She is following up with gynecology and has a hysterectomy planned for February.  6.  Diabetes mellitus screening Labs today.  7. Screening for cholesterol level Labs today.   This note was prepared with assistance  of Systems analyst. Occasional wrong-word or sound-a-like substitutions may have occurred due to the inherent limitations of voice recognition software.   Meosha Castanon M Kc [place], PA-C

## 2021-01-22 ENCOUNTER — Other Ambulatory Visit: Payer: Self-pay | Admitting: Obstetrics and Gynecology

## 2021-01-22 DIAGNOSIS — R928 Other abnormal and inconclusive findings on diagnostic imaging of breast: Secondary | ICD-10-CM

## 2021-01-29 ENCOUNTER — Other Ambulatory Visit: Payer: Self-pay | Admitting: Family

## 2021-01-29 DIAGNOSIS — I10 Essential (primary) hypertension: Secondary | ICD-10-CM

## 2021-02-15 ENCOUNTER — Ambulatory Visit
Admission: RE | Admit: 2021-02-15 | Discharge: 2021-02-15 | Disposition: A | Discharge status: Home / Self Care | Payer: BC Managed Care – PPO | Source: Ambulatory Visit | Attending: Obstetrics and Gynecology | Admitting: Obstetrics and Gynecology

## 2021-02-15 DIAGNOSIS — R928 Other abnormal and inconclusive findings on diagnostic imaging of breast: Secondary | ICD-10-CM

## 2021-03-04 DIAGNOSIS — N6001 Solitary cyst of right breast: Secondary | ICD-10-CM

## 2021-03-04 HISTORY — DX: Solitary cyst of right breast: N60.01

## 2021-03-05 ENCOUNTER — Other Ambulatory Visit: Payer: Self-pay | Admitting: Family

## 2021-03-05 DIAGNOSIS — I10 Essential (primary) hypertension: Secondary | ICD-10-CM

## 2021-03-06 NOTE — Telephone Encounter (Signed)
Patient has established with [facility] on 11/17.  Is requesting refills.

## 2021-03-30 ENCOUNTER — Encounter (HOSPITAL_BASED_OUTPATIENT_CLINIC_OR_DEPARTMENT_OTHER): Payer: Self-pay | Admitting: Obstetrics and Gynecology

## 2021-03-30 ENCOUNTER — Other Ambulatory Visit: Payer: Self-pay

## 2021-03-30 DIAGNOSIS — D251 Intramural leiomyoma of uterus: Secondary | ICD-10-CM | POA: Diagnosis not present

## 2021-03-30 DIAGNOSIS — Z01818 Encounter for other preprocedural examination: Secondary | ICD-10-CM | POA: Diagnosis present

## 2021-03-30 NOTE — Progress Notes (Addendum)
PLEASE WEAR A MASK OUT IN PUBLIC AND SOCIAL DISTANCE AND [place] YOUR HANDS FREQUENTLY. PLEASE ASK ALL YOUR CLOSE HOUSEHOLD CONTACT TO WEAR MASK OUT IN PUBLIC AND SOCIAL DISTANCE AND [place] HANDS FREQUENTLY ALSO.      Your procedure is scheduled on Monday, 04/09/21.  Report to [facility].   Call this number if you have problems the morning of surgery  :[phone].   OUR ADDRESS IS [address].  WE ARE LOCATED IN [facility].  PLEASE BRING YOUR INSURANCE CARD AND PHOTO ID DAY OF SURGERY.  ONLY ONE PERSON ALLOWED IN FACILITY WAITING AREA.                                     REMEMBER:  DO NOT EAT FOOD, CANDY GUM OR MINTS  AFTER MIDNIGHT THE NIGHT BEFORE YOUR SURGERY . YOU MAY HAVE CLEAR LIQUIDS FROM MIDNIGHT THE NIGHT BEFORE YOUR SURGERY UNTIL  4:30 A.M.. NO CLEAR LIQUIDS AFTER   4:30 A.M. DAY OF SURGERY.   YOU MAY  BRUSH YOUR TEETH MORNING OF SURGERY AND RINSE YOUR MOUTH OUT, NO CHEWING GUM CANDY OR MINTS.    CLEAR LIQUID DIET   Foods Allowed                                                                     Foods Excluded  Coffee and tea, regular and decaf                             liquids that you cannot  Plain Jell-O any favor except red or purple                                           see through such as: Fruit ices (not with fruit pulp)                                     milk, soups, orange juice  Iced Popsicles                                    All solid food Carbonated beverages, regular and diet                                    Cranberry, grape and apple juices Sports drinks like Gatorade  Sample Menu Breakfast                                Lunch  Supper Cranberry juice                                           Jell-O                                     Grape juice                           Apple juice Coffee or tea                        Jell-O                                       Popsicle                                                Coffee or tea                        Coffee or tea  _____________________________________________________________________     TAKE THESE MEDICATIONS MORNING OF SURGERY WITH A SIP OF WATER:  Norvasc & Prozac  ONE VISITOR IS ALLOWED IN WAITING ROOM ONLY DAY OF SURGERY.  YOU MAY HAVE ANOTHER PERSON SWITCH OUT WITH THE  1  VISITOR IN THE WAITING ROOM DAY OF SURGERY AND A MASK MUST BE WORN IN THE WAITING ROOM.    2 VISITORS  MAY VISIT IN THE EXTENDED RECOVERY ROOM UNTIL 800 PM ONLY 1 VISITOR AGE 99 AND OVER MAY SPEND THE NIGHT AND MUST BE IN EXTENDED RECOVERY ROOM NO LATER THAN 800 PM .    UP TO 2 CHILDREN AGE 64 TO 15 MAY ALSO VISIT IN EXTENDED RECOV ERY ROOM ONLY UNTIL 800 PM AND MUST LEAVE BY 800 PM.   ALL PERSONS VISITING IN EXTENDED RECOVERY ROOM MUST WEAR A MASK.                                    DO NOT WEAR JEWERLY, MAKE UP. DO NOT WEAR LOTIONS, POWDERS, PERFUMES OR NAIL POLISH ON YOUR FINGERNAILS. TOENAIL POLISH IS OK TO WEAR. DO NOT SHAVE FOR 48 HOURS PRIOR TO DAY OF SURGERY. MEN MAY SHAVE FACE AND NECK. CONTACTS, GLASSES, OR DENTURES MAY NOT BE WORN TO SURGERY.                                    [facility] IS NOT RESPONSIBLE  FOR ANY BELONGINGS.                                                                    [facility]  [facility] - Preparing for Surgery Before surgery, you can play an important role.  Because skin is not sterile, your skin needs to be as free of germs as possible.  You can reduce the number of germs on your skin by washing with CHG (chlorahexidine gluconate) soap before surgery.  CHG is an antiseptic cleaner which kills germs and bonds with the skin to continue killing germs even after washing. Please DO NOT use if you have an allergy to CHG or antibacterial soaps.  If your skin becomes reddened/irritated stop using the CHG and inform your nurse when you arrive at Short Stay. Do not shave  (including legs and underarms) for at least 48 hours prior to the first CHG shower.  You may shave your face/neck. Please follow these instructions carefully:  1.  Shower with CHG Soap the night before surgery and the  morning of Surgery.  2.  If you choose to wash your hair, wash your hair first as usual with your  normal  shampoo.  3.  After you shampoo, rinse your hair and body thoroughly to remove the  shampoo.                            4.  Use CHG as you would any other liquid soap.  You can apply chg directly  to the skin and wash                      Gently with a scrungie or clean washcloth.  5.  Apply the CHG Soap to your body ONLY FROM THE NECK DOWN.   Do not use on face/ open                           Wound or open sores. Avoid contact with eyes, ears mouth and genitals (private parts).                       Wash face,  Genitals (private parts) with your normal soap.             6.  Wash thoroughly, paying special attention to the area where your surgery  will be performed.  7.  Thoroughly rinse your body with warm water from the neck down.  8.  DO NOT shower/wash with your normal soap after using and rinsing off  the CHG Soap.                9.  Pat yourself dry with a clean towel.            10.  Wear clean pajamas.            11.  Place clean sheets on your bed the night of your first shower and do not  sleep with pets. Day of Surgery : Do not apply any lotions/deodorants the morning of surgery.  Please wear clean clothes to the hospital/surgery center.  IF YOU HAVE ANY SKIN IRRITATION OR PROBLEMS WITH THE SURGICAL SOAP, PLEASE GET A BAR OF GOLD DIAL SOAP AND SHOWER THE NIGHT BEFORE YOUR SURGERY AND THE MORNING OF YOUR SURGERY. PLEASE LET THE NURSE KNOW MORNING OF YOUR SURGERY IF YOU HAD ANY PROBLEMS WITH THE SURGICAL SOAP.   ________________________________________________________________________  QUESTIONS CALL Monie Shere PRE  OP NURSE PHONE [phone].

## 2021-03-30 NOTE — Progress Notes (Signed)
Spoke w/ via phone for pre-op interview---pt Lab needs dos---- urine pregnancy POCT              Lab results-----04/05/21 appt for CBC, CMP, type & screen, EKG COVID test -----patient states asymptomatic no test needed Arrive at -------0530 on 04/09/21 NPO after MN NO Solid Food.  Clear liquids from MN until---0430 Med rec completed Medications to take morning of surgery -----Norvasc, Prozac Diabetic medication -----n/a Patient instructed no nail polish to be worn day of surgery Patient instructed to bring photo id and insurance card day of surgery Patient aware to have Driver (ride ) / caregiver    for 24 hours after surgery - husband Natalie Guzman Patient Special Instructions -----Extended recovery instructions given. No marijuana edibles 24 hours before surgery. Patient agreed. She stated that she only used the edibles due to her severe pain. Pre-Op special Istructions -----none Patient verbalized understanding of instructions that were given at this phone interview. Patient denies shortness of breath, chest pain, fever, cough at this phone interview.

## 2021-04-05 ENCOUNTER — Other Ambulatory Visit: Payer: Self-pay

## 2021-04-05 ENCOUNTER — Encounter (HOSPITAL_COMMUNITY)
Admission: RE | Admit: 2021-04-05 | Discharge: 2021-04-05 | Disposition: A | Discharge status: Home / Self Care | Payer: BC Managed Care – PPO | Source: Ambulatory Visit | Attending: Obstetrics and Gynecology | Admitting: Obstetrics and Gynecology

## 2021-04-05 DIAGNOSIS — D251 Intramural leiomyoma of uterus: Secondary | ICD-10-CM | POA: Diagnosis not present

## 2021-04-05 DIAGNOSIS — Z01818 Encounter for other preprocedural examination: Secondary | ICD-10-CM | POA: Diagnosis not present

## 2021-04-05 LAB — COMPREHENSIVE METABOLIC PANEL
ALT: 17 U/L (ref 0–44)
AST: 22 U/L (ref 15–41)
Albumin: 3.9 g/dL (ref 3.5–5.0)
Alkaline Phosphatase: 63 U/L (ref 38–126)
Anion gap: 11 (ref 5–15)
BUN: 20 mg/dL (ref 6–20)
CO2: 24 mmol/L (ref 22–32)
Calcium: 9.1 mg/dL (ref 8.9–10.3)
Chloride: 102 mmol/L (ref 98–111)
Creatinine, Ser: 0.56 mg/dL (ref 0.44–1.00)
GFR, Estimated: >60 mL/min (ref >60)
Glucose, Bld: 97 mg/dL (ref 70–99)
Potassium: 3.1 mmol/L — ABNORMAL LOW (ref 3.5–5.1)
Sodium: 137 mmol/L (ref 135–145)
Total Bilirubin: 0.3 mg/dL (ref 0.3–1.2)
Total Protein: 7.6 g/dL (ref 6.5–8.1)

## 2021-04-05 LAB — CBC
HCT: 38.9 % (ref 36.0–46.0)
Hemoglobin: 12.4 g/dL (ref 12.0–15.0)
MCH: 27.4 pg (ref 26.0–34.0)
MCHC: 31.9 g/dL (ref 30.0–36.0)
MCV: 86.1 fL (ref 80.0–100.0)
Platelets: 372 10*3/uL (ref 150–400)
RBC: 4.52 MIL/uL (ref 3.87–5.11)
RDW: 14.4 % (ref 11.5–15.5)
WBC: 5.8 10*3/uL (ref 4.0–10.5)
nRBC: 0 % (ref 0.0–0.2)

## 2021-04-07 NOTE — Anesthesia Preprocedure Evaluation (Addendum)
Anesthesia Evaluation  Patient identified by MRN, date of birth, ID band Patient awake    Reviewed: Allergy & Precautions, NPO status , Patient's Chart, lab work & pertinent test results  Airway Mallampati: II  TM Distance: >3 FB Neck ROM: Full    Dental  (+) Teeth Intact, Dental Advisory Given   Pulmonary asthma ,    Pulmonary exam normal breath sounds clear to auscultation       Cardiovascular hypertension, Pt. on medications Normal cardiovascular exam Rhythm:Regular Rate:Normal     Neuro/Psych PSYCHIATRIC DISORDERS Anxiety Depression negative neurological ROS     GI/Hepatic negative GI ROS, Neg liver ROS,   Endo/Other  negative endocrine ROSObesity   Renal/GU negative Renal ROS     Musculoskeletal negative musculoskeletal ROS (+)   Abdominal   Peds  Hematology negative hematology ROS (+)   Anesthesia Other Findings   Reproductive/Obstetrics uterine leiomyoma                           Anesthesia Physical Anesthesia Plan  ASA: 2  Anesthesia Plan: General   Post-op Pain Management: Tylenol PO (pre-op) and Toradol IV (intra-op)   Induction: Intravenous  PONV Risk Score and Plan: 3 and Midazolam, Dexamethasone and Ondansetron  Airway Management Planned: Oral ETT  Additional Equipment:   Intra-op Plan:   Post-operative Plan: Extubation in OR  Informed Consent: I have reviewed the patients History and Physical, chart, labs and discussed the procedure including the risks, benefits and alternatives for the proposed anesthesia with the patient or authorized representative who has indicated his/her understanding and acceptance.     Dental advisory given  Plan Discussed with: CRNA  Anesthesia Plan Comments:        Anesthesia Quick Evaluation

## 2021-04-08 NOTE — H&P (Signed)
Natalie Guzman is an 46 y.o. female G2P2 with uterine fibroids, intramural and submucosal.  D/W pt r/b/a, process and expectations of surgery.  Pt with US showing 2 intramural fibroids and a submucosal fibroids as well as normal ovaries.  Pt allergic to ibuprofen (anaphyaxis) with use tylenol and narcotics for pain control.    Pertinent Gynecological History: G2P2 SVD x 2 No STI H/o abn pap - had LEEP 2016 - last 2021 HR HPV neg Last MMG BiRads 2 Menstrual History:  Patient's last menstrual period was 03/05/2021 (exact date).    Past Medical History:  Diagnosis Date   Allergic rhinitis    Anxiety    Breast cyst, right 2023   Pt states she was told the cyst was benign.   Depression    H/O abnormal cervical Papanicolaou smear    Hypertension    Mild persistent asthma    Uterine fibroid 2022    Past Surgical History:  Procedure Laterality Date   BREAST BIOPSY Right    around 2017   CERVICAL BIOPSY  W/ LOOP ELECTRODE EXCISION     when pt was in her 20's   TONSILLECTOMY  1992    Family History  Problem Relation Age of Onset   Hypertension Mother    Hypertension Father    Cerebral aneurysm Sister    Breast cancer Maternal Grandmother    Breast cancer Paternal Grandmother    Breast cancer Maternal Aunt    Breast cancer Paternal Aunt     Social History:  reports that she has never smoked. She has never used smokeless tobacco. She reports that she does not currently use alcohol. She reports current drug use. Drug: Marijuana. CBD and THC gummies, married  Allergies:  Allergies  Allergen Reactions   Ibuprofen Shortness Of Breath   Avocado Itching and Swelling    Eds: Amlodipine 10mg , Fluoxetine 40mg , HCTZ 25mg     Review of Systems  Constitutional: Negative.   HENT: Negative.    Respiratory: Negative.    Cardiovascular: Negative.   Gastrointestinal: Negative.   Genitourinary:  Positive for menstrual problem and vaginal bleeding.  Musculoskeletal: Negative.    Skin: Negative.   Neurological: Negative.   Psychiatric/Behavioral: Negative.     Weight 65.8 kg, last menstrual period 03/05/2021. Physical Exam Constitutional:      Appearance: Normal appearance.  HENT:     Head: Normocephalic and atraumatic.  Cardiovascular:     Rate and Rhythm: Normal rate and regular rhythm.  Pulmonary:     Effort: Pulmonary effort is normal.     Breath sounds: Normal breath sounds.  Abdominal:     General: Bowel sounds are normal.     Palpations: Abdomen is soft.  Genitourinary:    General: Normal vulva.     Rectum: Normal.  Musculoskeletal:        General: Normal range of motion.     Cervical back: Normal range of motion and neck supple.  Skin:    General: Skin is warm and dry.  Neurological:     General: No focal deficit present.     Mental Status: She is alert and oriented to person, place, and time.  Psychiatric:        Mood and Affect: Mood normal.        Behavior: Behavior normal.    [place] as above, nl preop labs  Assessment/Plan: 45yo G2P2 with submucosal fibroid for definitive management with LAVH/BS and cysto Ancef for prophylaxis Careful pain control   Natalie Guzman 04/08/2021,  11:52 AM

## 2021-04-09 ENCOUNTER — Encounter (HOSPITAL_BASED_OUTPATIENT_CLINIC_OR_DEPARTMENT_OTHER)
Admission: RE | Disposition: A | Discharge status: Home / Self Care | Payer: Self-pay | Source: Home / Self Care | Attending: Obstetrics and Gynecology

## 2021-04-09 ENCOUNTER — Observation Stay (HOSPITAL_BASED_OUTPATIENT_CLINIC_OR_DEPARTMENT_OTHER): Payer: BC Managed Care – PPO | Admitting: Anesthesiology

## 2021-04-09 ENCOUNTER — Encounter (HOSPITAL_BASED_OUTPATIENT_CLINIC_OR_DEPARTMENT_OTHER): Payer: Self-pay | Admitting: Obstetrics and Gynecology

## 2021-04-09 ENCOUNTER — Observation Stay (HOSPITAL_BASED_OUTPATIENT_CLINIC_OR_DEPARTMENT_OTHER)
Admission: RE | Admit: 2021-04-09 | Discharge: 2021-04-09 | Disposition: A | Discharge status: Home / Self Care | Payer: BC Managed Care – PPO | Attending: Obstetrics and Gynecology | Admitting: Obstetrics and Gynecology

## 2021-04-09 ENCOUNTER — Other Ambulatory Visit: Payer: Self-pay

## 2021-04-09 DIAGNOSIS — D251 Intramural leiomyoma of uterus: Principal | ICD-10-CM | POA: Insufficient documentation

## 2021-04-09 DIAGNOSIS — D250 Submucous leiomyoma of uterus: Secondary | ICD-10-CM | POA: Insufficient documentation

## 2021-04-09 DIAGNOSIS — I10 Essential (primary) hypertension: Secondary | ICD-10-CM | POA: Insufficient documentation

## 2021-04-09 DIAGNOSIS — D252 Subserosal leiomyoma of uterus: Secondary | ICD-10-CM | POA: Diagnosis not present

## 2021-04-09 DIAGNOSIS — N838 Other noninflammatory disorders of ovary, fallopian tube and broad ligament: Secondary | ICD-10-CM | POA: Diagnosis not present

## 2021-04-09 DIAGNOSIS — N72 Inflammatory disease of cervix uteri: Secondary | ICD-10-CM | POA: Diagnosis not present

## 2021-04-09 DIAGNOSIS — Z90710 Acquired absence of both cervix and uterus: Secondary | ICD-10-CM | POA: Diagnosis present

## 2021-04-09 DIAGNOSIS — J45909 Unspecified asthma, uncomplicated: Secondary | ICD-10-CM | POA: Insufficient documentation

## 2021-04-09 DIAGNOSIS — D259 Leiomyoma of uterus, unspecified: Secondary | ICD-10-CM | POA: Diagnosis present

## 2021-04-09 HISTORY — PX: LAPAROSCOPIC VAGINAL HYSTERECTOMY WITH SALPINGECTOMY: SHX6680

## 2021-04-09 HISTORY — PX: LYSIS OF ADHESION: SHX5961

## 2021-04-09 HISTORY — PX: CYSTOSCOPY: SHX5120

## 2021-04-09 LAB — BASIC METABOLIC PANEL
Anion gap: 10 (ref 5–15)
BUN: 10 mg/dL (ref 6–20)
CO2: 22 mmol/L (ref 22–32)
Calcium: 8.5 mg/dL — ABNORMAL LOW (ref 8.9–10.3)
Chloride: 101 mmol/L (ref 98–111)
Creatinine, Ser: 0.53 mg/dL (ref 0.44–1.00)
GFR, Estimated: >60 mL/min (ref >60)
Glucose, Bld: 162 mg/dL — ABNORMAL HIGH (ref 70–99)
Potassium: 2.9 mmol/L — ABNORMAL LOW (ref 3.5–5.1)
Sodium: 133 mmol/L — ABNORMAL LOW (ref 135–145)

## 2021-04-09 LAB — CBC
HCT: 37.1 % (ref 36.0–46.0)
Hemoglobin: 12.1 g/dL (ref 12.0–15.0)
MCH: 27.8 pg (ref 26.0–34.0)
MCHC: 32.6 g/dL (ref 30.0–36.0)
MCV: 85.1 fL (ref 80.0–100.0)
Platelets: 326 10*3/uL (ref 150–400)
RBC: 4.36 MIL/uL (ref 3.87–5.11)
RDW: 14 % (ref 11.5–15.5)
WBC: 12 10*3/uL — ABNORMAL HIGH (ref 4.0–10.5)
nRBC: 0 % (ref 0.0–0.2)

## 2021-04-09 LAB — ABO/RH: ABO/RH(D): A POS

## 2021-04-09 LAB — TYPE AND SCREEN
ABO/RH(D): A POS
Antibody Screen: NEGATIVE

## 2021-04-09 LAB — POCT PREGNANCY, URINE: Preg Test, Ur: NEGATIVE

## 2021-04-09 SURGERY — HYSTERECTOMY, VAGINAL, LAPAROSCOPY-ASSISTED, WITH SALPINGECTOMY
Anesthesia: General

## 2021-04-09 MED ORDER — PROMETHAZINE HCL 25 MG/ML IJ SOLN
6.2500 mg | INTRAMUSCULAR | Status: DC | PRN
  Administered 2021-04-09: 6.25 mg via INTRAVENOUS

## 2021-04-09 MED ORDER — LIDOCAINE 2% (20 MG/ML) 5 ML SYRINGE
INTRAMUSCULAR | Status: DC | PRN
  Administered 2021-04-09: 80 mg via INTRAVENOUS

## 2021-04-09 MED ORDER — HYDROCHLOROTHIAZIDE 25 MG PO TABS
25.0000 mg | ORAL_TABLET | Freq: Every day | ORAL | Status: DC
  Administered 2021-04-09: 25 mg via ORAL
  Filled 2021-04-09: qty 1

## 2021-04-09 MED ORDER — CEFAZOLIN SODIUM-DEXTROSE 2-4 GM/100ML-% IV SOLN
2.0000 g | INTRAVENOUS | Status: AC
  Administered 2021-04-09: 2 g via INTRAVENOUS

## 2021-04-09 MED ORDER — MIDAZOLAM HCL 2 MG/2ML IJ SOLN
INTRAMUSCULAR | Status: DC | PRN
  Administered 2021-04-09: 2 mg via INTRAVENOUS

## 2021-04-09 MED ORDER — LIDOCAINE HCL (PF) 2 % IJ SOLN
INTRAMUSCULAR | Status: AC
  Filled 2021-04-09: qty 5

## 2021-04-09 MED ORDER — GABAPENTIN 300 MG PO CAPS
300.0000 mg | ORAL_CAPSULE | ORAL | Status: AC
  Administered 2021-04-09: 300 mg via ORAL

## 2021-04-09 MED ORDER — POVIDONE-IODINE 10 % EX SWAB: 2.0000 "application " | Freq: Once | CUTANEOUS | Status: DC

## 2021-04-09 MED ORDER — ONDANSETRON HCL 4 MG/2ML IJ SOLN: 4.0000 mg | Freq: Four times a day (QID) | INTRAMUSCULAR | Status: DC | PRN

## 2021-04-09 MED ORDER — ONDANSETRON HCL 4 MG/2ML IJ SOLN
4.0000 mg | Freq: Four times a day (QID) | INTRAMUSCULAR | Status: DC | PRN
  Administered 2021-04-09: 4 mg via INTRAVENOUS

## 2021-04-09 MED ORDER — SCOPOLAMINE 1 MG/3DAYS TD PT72
MEDICATED_PATCH | TRANSDERMAL | Status: AC
  Filled 2021-04-09: qty 1

## 2021-04-09 MED ORDER — PROPOFOL 500 MG/50ML IV EMUL
INTRAVENOUS | Status: AC
  Filled 2021-04-09: qty 50

## 2021-04-09 MED ORDER — MIDAZOLAM HCL 2 MG/2ML IJ SOLN
INTRAMUSCULAR | Status: AC
  Filled 2021-04-09: qty 2

## 2021-04-09 MED ORDER — NALOXONE HCL 0.4 MG/ML IJ SOLN: 0.4000 mg | INTRAMUSCULAR | Status: DC | PRN

## 2021-04-09 MED ORDER — SODIUM CHLORIDE 0.9% FLUSH: 9.0000 mL | INTRAVENOUS | Status: DC | PRN

## 2021-04-09 MED ORDER — SCOPOLAMINE 1 MG/3DAYS TD PT72
1.0000 | MEDICATED_PATCH | Freq: Once | TRANSDERMAL | Status: DC
  Administered 2021-04-09: 1.5 mg via TRANSDERMAL

## 2021-04-09 MED ORDER — OXYCODONE-ACETAMINOPHEN 5-325 MG PO TABS: 1.0000 | ORAL_TABLET | Freq: Four times a day (QID) | ORAL | 0 refills | Status: DC | PRN

## 2021-04-09 MED ORDER — PROPOFOL 10 MG/ML IV BOLUS
INTRAVENOUS | Status: DC | PRN
  Administered 2021-04-09: 150 mg via INTRAVENOUS

## 2021-04-09 MED ORDER — FENTANYL CITRATE (PF) 100 MCG/2ML IJ SOLN: 25.0000 ug | INTRAMUSCULAR | Status: DC | PRN

## 2021-04-09 MED ORDER — DIPHENHYDRAMINE HCL 50 MG/ML IJ SOLN: 12.5000 mg | Freq: Four times a day (QID) | INTRAMUSCULAR | Status: DC | PRN

## 2021-04-09 MED ORDER — GUAIFENESIN 100 MG/5ML PO LIQD
15.0000 mL | ORAL | Status: DC | PRN
  Filled 2021-04-09: qty 15

## 2021-04-09 MED ORDER — ONDANSETRON HCL 4 MG PO TABS: 4.0000 mg | ORAL_TABLET | Freq: Four times a day (QID) | ORAL | Status: DC | PRN

## 2021-04-09 MED ORDER — ONDANSETRON HCL 4 MG/2ML IJ SOLN
INTRAMUSCULAR | Status: DC | PRN
  Administered 2021-04-09: 4 mg via INTRAVENOUS

## 2021-04-09 MED ORDER — PROMETHAZINE HCL 12.5 MG PO TABS: 12.5000 mg | ORAL_TABLET | Freq: Four times a day (QID) | ORAL | 0 refills | Status: DC | PRN

## 2021-04-09 MED ORDER — FLUOXETINE HCL 20 MG PO CAPS
40.0000 mg | ORAL_CAPSULE | Freq: Every day | ORAL | Status: DC
  Filled 2021-04-09: qty 2

## 2021-04-09 MED ORDER — ONDANSETRON HCL 4 MG/2ML IJ SOLN
INTRAMUSCULAR | Status: AC
  Filled 2021-04-09: qty 2

## 2021-04-09 MED ORDER — FENTANYL CITRATE (PF) 100 MCG/2ML IJ SOLN
INTRAMUSCULAR | Status: AC
  Filled 2021-04-09: qty 2

## 2021-04-09 MED ORDER — POTASSIUM CHLORIDE 2 MEQ/ML IV SOLN
INTRAVENOUS | Status: DC
  Filled 2021-04-09 (×3): qty 1000

## 2021-04-09 MED ORDER — SIMETHICONE 80 MG PO CHEW: 80.0000 mg | CHEWABLE_TABLET | Freq: Four times a day (QID) | ORAL | Status: DC | PRN

## 2021-04-09 MED ORDER — HYDROMORPHONE HCL 1 MG/ML IJ SOLN
INTRAMUSCULAR | Status: AC
  Filled 2021-04-09: qty 1

## 2021-04-09 MED ORDER — DEXAMETHASONE SODIUM PHOSPHATE 10 MG/ML IJ SOLN
INTRAMUSCULAR | Status: DC | PRN
  Administered 2021-04-09: 10 mg via INTRAVENOUS

## 2021-04-09 MED ORDER — ROCURONIUM BROMIDE 10 MG/ML (PF) SYRINGE
PREFILLED_SYRINGE | INTRAVENOUS | Status: DC | PRN
  Administered 2021-04-09: 60 mg via INTRAVENOUS

## 2021-04-09 MED ORDER — ACETAMINOPHEN 500 MG PO TABS
1000.0000 mg | ORAL_TABLET | ORAL | Status: AC
  Administered 2021-04-09: 1000 mg via ORAL

## 2021-04-09 MED ORDER — EPHEDRINE 5 MG/ML INJ
INTRAVENOUS | Status: AC
  Filled 2021-04-09: qty 5

## 2021-04-09 MED ORDER — BUPIVACAINE HCL (PF) 0.25 % IJ SOLN
INTRAMUSCULAR | Status: DC | PRN
Start: 2021-04-09 — End: 2021-04-09
  Administered 2021-04-09: 9 mL

## 2021-04-09 MED ORDER — ROCURONIUM BROMIDE 10 MG/ML (PF) SYRINGE
PREFILLED_SYRINGE | INTRAVENOUS | Status: AC
  Filled 2021-04-09: qty 10

## 2021-04-09 MED ORDER — HYDROMORPHONE HCL 1 MG/ML IJ SOLN
0.2000 mg | INTRAMUSCULAR | Status: DC | PRN
  Administered 2021-04-09 (×2): 0.5 mg via INTRAVENOUS

## 2021-04-09 MED ORDER — LACTATED RINGERS IV SOLN: INTRAVENOUS | Status: DC

## 2021-04-09 MED ORDER — DEXAMETHASONE SODIUM PHOSPHATE 10 MG/ML IJ SOLN
INTRAMUSCULAR | Status: AC
  Filled 2021-04-09: qty 1

## 2021-04-09 MED ORDER — OXYCODONE-ACETAMINOPHEN 5-325 MG PO TABS
ORAL_TABLET | ORAL | Status: AC
  Filled 2021-04-09: qty 1

## 2021-04-09 MED ORDER — EPHEDRINE SULFATE-NACL 50-0.9 MG/10ML-% IV SOSY
PREFILLED_SYRINGE | INTRAVENOUS | Status: DC | PRN
  Administered 2021-04-09 (×2): 5 mg via INTRAVENOUS

## 2021-04-09 MED ORDER — PROMETHAZINE HCL 25 MG/ML IJ SOLN
INTRAMUSCULAR | Status: AC
  Filled 2021-04-09: qty 1

## 2021-04-09 MED ORDER — VASOPRESSIN 20 UNIT/ML IV SOLN
INTRAVENOUS | Status: DC | PRN
  Administered 2021-04-09: 18 mL via INTRAMUSCULAR

## 2021-04-09 MED ORDER — CEFAZOLIN SODIUM-DEXTROSE 2-4 GM/100ML-% IV SOLN
INTRAVENOUS | Status: AC
  Filled 2021-04-09: qty 100

## 2021-04-09 MED ORDER — OXYCODONE-ACETAMINOPHEN 5-325 MG PO TABS
1.0000 | ORAL_TABLET | ORAL | Status: DC | PRN
  Administered 2021-04-09: 2 via ORAL
  Administered 2021-04-09 (×2): 1 via ORAL

## 2021-04-09 MED ORDER — PHENYLEPHRINE 40 MCG/ML (10ML) SYRINGE FOR IV PUSH (FOR BLOOD PRESSURE SUPPORT)
PREFILLED_SYRINGE | INTRAVENOUS | Status: AC
  Filled 2021-04-09: qty 10

## 2021-04-09 MED ORDER — FENTANYL CITRATE (PF) 100 MCG/2ML IJ SOLN
INTRAMUSCULAR | Status: DC | PRN
Start: 2021-04-09 — End: 2021-04-09
  Administered 2021-04-09 (×3): 50 ug via INTRAVENOUS
  Administered 2021-04-09: 100 ug via INTRAVENOUS
  Administered 2021-04-09 (×2): 50 ug via INTRAVENOUS

## 2021-04-09 MED ORDER — OXYCODONE-ACETAMINOPHEN 5-325 MG PO TABS
ORAL_TABLET | ORAL | Status: AC
  Filled 2021-04-09: qty 2

## 2021-04-09 MED ORDER — ACETAMINOPHEN 500 MG PO TABS
ORAL_TABLET | ORAL | Status: AC
  Filled 2021-04-09: qty 2

## 2021-04-09 MED ORDER — PHENYLEPHRINE 40 MCG/ML (10ML) SYRINGE FOR IV PUSH (FOR BLOOD PRESSURE SUPPORT)
PREFILLED_SYRINGE | INTRAVENOUS | Status: DC | PRN
  Administered 2021-04-09 (×2): 120 ug via INTRAVENOUS
  Administered 2021-04-09 (×2): 80 ug via INTRAVENOUS

## 2021-04-09 MED ORDER — ALUM & MAG HYDROXIDE-SIMETH 200-200-20 MG/5ML PO SUSP: 30.0000 mL | ORAL | Status: DC | PRN

## 2021-04-09 MED ORDER — SUGAMMADEX SODIUM 200 MG/2ML IV SOLN
INTRAVENOUS | Status: DC | PRN
  Administered 2021-04-09: 200 mg via INTRAVENOUS

## 2021-04-09 MED ORDER — MENTHOL 3 MG MT LOZG: 1.0000 | LOZENGE | OROMUCOSAL | Status: DC | PRN

## 2021-04-09 MED ORDER — FENTANYL CITRATE (PF) 250 MCG/5ML IJ SOLN
INTRAMUSCULAR | Status: AC
  Filled 2021-04-09: qty 5

## 2021-04-09 MED ORDER — DIPHENHYDRAMINE HCL 12.5 MG/5ML PO ELIX: 12.5000 mg | ORAL_SOLUTION | Freq: Four times a day (QID) | ORAL | Status: DC | PRN

## 2021-04-09 MED ORDER — SODIUM CHLORIDE 0.9 % IR SOLN
Status: DC | PRN
  Administered 2021-04-09: 1000 mL

## 2021-04-09 MED ORDER — GABAPENTIN 300 MG PO CAPS
ORAL_CAPSULE | ORAL | Status: AC
  Filled 2021-04-09: qty 1

## 2021-04-09 MED ORDER — AMLODIPINE BESYLATE 10 MG PO TABS
10.0000 mg | ORAL_TABLET | Freq: Every day | ORAL | Status: DC
  Filled 2021-04-09: qty 1

## 2021-04-09 SURGICAL SUPPLY — 45 items
ADH SKN CLS APL DERMABOND .7 (GAUZE/BANDAGES/DRESSINGS) ×3
APL SRG 38 LTWT LNG FL B (MISCELLANEOUS)
APPLICATOR ARISTA FLEXITIP XL (MISCELLANEOUS) IMPLANT
CABLE HIGH FREQUENCY MONO STRZ (ELECTRODE) IMPLANT
CNTNR URN SCR LID CUP LEK RST (MISCELLANEOUS) ×3 IMPLANT
CONT SPEC 4OZ STRL OR WHT (MISCELLANEOUS)
COVER BACK TABLE 60X90IN (DRAPES) ×4 IMPLANT
COVER MAYO STAND STRL (DRAPES) ×8 IMPLANT
DECANTER SPIKE VIAL GLASS SM (MISCELLANEOUS) IMPLANT
DERMABOND ADVANCED (GAUZE/BANDAGES/DRESSINGS) ×1
DERMABOND ADVANCED .7 DNX12 (GAUZE/BANDAGES/DRESSINGS) ×3 IMPLANT
DRSG COVADERM PLUS 2X2 (GAUZE/BANDAGES/DRESSINGS) IMPLANT
DRSG OPSITE POSTOP 3X4 (GAUZE/BANDAGES/DRESSINGS) IMPLANT
DURAPREP 26ML APPLICATOR (WOUND CARE) ×4 IMPLANT
ELECT REM PT RETURN 9FT ADLT (ELECTROSURGICAL) ×4
ELECTRODE REM PT RTRN 9FT ADLT (ELECTROSURGICAL) ×3 IMPLANT
GAUZE 4X4 16PLY ~~LOC~~+RFID DBL (SPONGE) ×11 IMPLANT
GLOVE SURG ENC MOIS LTX SZ6.5 (GLOVE) ×12 IMPLANT
HEMOSTAT ARISTA ABSORB 3G PWDR (HEMOSTASIS) IMPLANT
HIBICLENS CHG 4% 4OZ BTL (MISCELLANEOUS) ×4 IMPLANT
KIT TURNOVER CYSTO (KITS) ×4 IMPLANT
NEEDLE INSUFFLATION 120MM (ENDOMECHANICALS) ×4 IMPLANT
NS IRRIG 1000ML POUR BTL (IV SOLUTION) ×4 IMPLANT
PACK LAVH (CUSTOM PROCEDURE TRAY) ×4 IMPLANT
PACK TRENDGUARD 450 HYBRID PRO (MISCELLANEOUS) ×3 IMPLANT
PAD OB MATERNITY 4.3X12.25 (PERSONAL CARE ITEMS) ×4 IMPLANT
PROTECTOR NERVE ULNAR (MISCELLANEOUS) ×6 IMPLANT
SET IRRIG Y TYPE TUR BLADDER L (SET/KITS/TRAYS/PACK) ×4 IMPLANT
SET SUCTION IRRIG HYDROSURG (IRRIGATION / IRRIGATOR) ×4 IMPLANT
SET TUBE SMOKE EVAC HIGH FLOW (TUBING) ×4 IMPLANT
SHEARS HARMONIC ACE PLUS 36CM (ENDOMECHANICALS) ×4 IMPLANT
SPONGE T-LAP 4X18 ~~LOC~~+RFID (SPONGE) ×4 IMPLANT
SUT VIC AB 1 CT1 18XBRD ANBCTR (SUTURE) ×6 IMPLANT
SUT VIC AB 1 CT1 8-18 (SUTURE) ×8
SUT VIC AB 2-0 CT1 (SUTURE) ×1 IMPLANT
SUT VIC AB 3-0 SH 27 (SUTURE)
SUT VIC AB 3-0 SH 27X BRD (SUTURE) IMPLANT
SUT VIC AB 4-0 PS2 27 (SUTURE) ×4 IMPLANT
SUT VICRYL 0 TIES 12 18 (SUTURE) ×4 IMPLANT
SUT VICRYL 0 UR6 27IN ABS (SUTURE) IMPLANT
TOWEL OR 17X26 10 PK STRL BLUE (TOWEL DISPOSABLE) ×4 IMPLANT
TRAY FOLEY W/BAG SLVR 14FR LF (SET/KITS/TRAYS/PACK) ×4 IMPLANT
TRENDGUARD 450 HYBRID PRO PACK (MISCELLANEOUS) ×4
TROCAR BLADELESS OPT 5 100 (ENDOMECHANICALS) ×12 IMPLANT
WARMER LAPAROSCOPE (MISCELLANEOUS) ×4 IMPLANT

## 2021-04-09 NOTE — Anesthesia Procedure Notes (Signed)
Procedure Name: Intubation Date/Time: 04/09/2021 7:34 AM Performed by: Genelle Bal, CRNA Pre-anesthesia Checklist: Patient identified, Emergency Drugs available, Suction available and Patient being monitored Patient Re-evaluated:Patient Re-evaluated prior to induction Oxygen Delivery Method: Circle system utilized Preoxygenation: Pre-oxygenation with 100% oxygen Induction Type: IV induction Ventilation: Mask ventilation without difficulty Laryngoscope Size: Miller and 2 Grade View: Grade I Tube type: Oral Tube size: 7.0 mm Number of attempts: 1 Airway Equipment and Method: Stylet and Oral airway Placement Confirmation: ETT inserted through vocal cords under direct vision, positive ETCO2 and breath sounds checked- equal and bilateral Secured at: 21 cm Tube secured with: Tape Dental Injury: Teeth and Oropharynx as per pre-operative assessment

## 2021-04-09 NOTE — Brief Op Note (Signed)
04/09/2021  9:24 AM  PATIENT:  Natalie Guzman  46 y.o. female  PRE-OPERATIVE DIAGNOSIS:  uterine leiomyoma, submucous and intramural  POST-OPERATIVE DIAGNOSIS:  uterine leiomyoma, submucous and intramural  PROCEDURE:  Procedure(s): LAPAROSCOPIC ASSISTED VAGINAL HYSTERECTOMY WITH SALPINGECTOMY (Bilateral) CYSTOSCOPY (N/A), LOA  SURGEON:  Surgeon(s) and Role:    * Bovard-Stuckert, Ishi Danser, MD - Primary  ASSISTANTS: Eula Flax, MD   ANESTHESIA:   local and general  EBL:  50 mL IVF per anesthesia, uop 200cc clear urine at end of case  BLOOD ADMINISTERED:none  DRAINS: Urinary Catheter (Foley)   LOCAL MEDICATIONS USED:  MARCAINE    and OTHER Vasopressin  SPECIMEN:  Source of Specimen:  uterus, cervix, Bilateral fallopian tubes  DISPOSITION OF SPECIMEN:  PATHOLOGY  COUNTS:  YES  TOURNIQUET:  * No tourniquets in log *  DICTATION: .Other Dictation: Dictation Number [id number]  PLAN OF CARE: Admit for overnight observation  PATIENT DISPOSITION:  PACU - hemodynamically stable.   Delay start of Pharmacological VTE agent (>24hrs) due to surgical blood loss or risk of bleeding: not applicable

## 2021-04-09 NOTE — Transfer of Care (Signed)
Immediate Anesthesia Transfer of Care Note  Patient: Natalie Guzman  Procedure(s) Performed: LAPAROSCOPIC ASSISTED VAGINAL HYSTERECTOMY WITH SALPINGECTOMY (Bilateral) CYSTOSCOPY  Patient Location: PACU  Anesthesia Type:General  Level of Consciousness: awake, alert  and oriented  Airway & Oxygen Therapy: Patient Spontanous Breathing and Patient connected to nasal cannula oxygen  Post-op Assessment: Report given to RN and Post -op Vital signs reviewed and stable  Post vital signs: Reviewed and stable  Last Vitals:  Vitals Value Taken Time  BP 119/80 04/09/21 0935  Temp    Pulse 107 04/09/21 0936  Resp 17 04/09/21 0936  SpO2 100 % 04/09/21 0936  Vitals shown include unvalidated device data.  Last Pain:  Vitals:   04/09/21 0606  TempSrc: Oral  PainSc: 0-No pain      Patients Stated Pain Goal: 5 (35/82/51 8984)  Complications: No notable events documented.

## 2021-04-09 NOTE — Op Note (Signed)
NAME: Natalie Guzman, Natalie A. MEDICAL RECORD NO: [id number] ACCOUNT NO: [id number] DATE OF BIRTH: 1975-07-16 FACILITY: [facility] LOCATION: [facility] PHYSICIAN: Janyth Contes, MD  Operative Report   DATE OF PROCEDURE: 04/09/2021  PREOPERATIVE DIAGNOSIS:  Uterine leiomyoma, submucosal and intramural.  POSTOPERATIVE DIAGNOSIS:  Uterine leiomyoma, submucosal and intramural, status post laparoscopic-assisted vaginal hysterectomy.  PROCEDURE:  Laparoscopic-assisted vaginal hysterectomy with bilateral salpingectomy and cystoscopy.  SURGEON: Janyth Contes, MD  ASSISTANT:  Eula Flax, MD  ANESTHESIA:  Local and general.  ESTIMATED BLOOD LOSS:  Approximately 50 mL.  IV FLUIDS:  Per anesthesia.  URINE OUTPUT:  200 mL clear urine at the end of the case.  COMPLICATIONS:  None.  PATHOLOGY:  Uterus, cervix and bilateral fallopian tubes.  DESCRIPTION OF PROCEDURE:  After informed consent was reviewed with the patient including risks, benefits and alternatives of the surgical procedure, she was transported to the operating room and placed on the table in supine position.  General  anesthesia was induced and found to be adequate.  She was then prepped and draped in the normal sterile fashion, placed in [place].  After an appropriate timeout was performed, an open-sided speculum was used to visualize her cervix and a Hulka  manipulator was placed.  The Foley catheter was also placed.  The instruments were removed.  Gloves and gowns were changed.  Attention was turned to the abdominal portion of the case.  An approximately 5 mm infraumbilical incision was made.  The fascia  was cleared with a hemostat and pneumoperitoneum was obtained after Veress needle was placed, checking with a hanging drop test, with an opening pressure of 0 mmHg.  The 5 mm trocar was placed through the umbilicus under direct visualization.  Accessory  ports were placed in both the right and left after  injection of Marcaine under direct visualization.  A brief pelvic survey performed, revealed a normal uterus with adhesion in the left adnexa.  The adhesions were lysed with the Harmonic scalpel.  The  tube was then grasped at the fimbriated end and excised to the level of the cornu.  The cardinal ligament was incised to the level of the bladder flap.  The bladder flap was created to the midline with Harmonic scalpel.  Bleeding was controlled with  Harmonic scalpel in a similar fashion on the right side, the fimbria was grasped and the tube was excised to the level of the cornu.  The round ligament was then incised and the cardinal ligaments were incised to the level of the uterine artery.  The  bladder flap was connected with the bladder flap from the left.  Hemostasis was assured.  Attention was turned to the vaginal portion of the case.  The cervix was injected with 1:100 mixture of vasopressin and circumscribed with Bovie cautery.  The  peritoneum was entered through the anterior cul-de-sac as well as sharply through the posterior cul-de-sac.  Banana speculum was placed.  The uterosacral ligaments were ligated and held bilaterally in a stepwise fashion, bilaterally, the pedicles were  created to the level of the uterine artery.  The uterus was delivered.  Hemostasis was assured.  The pedicles were inspected.  The uterosacral ligaments were plicated. The held sutures were then tied together.  The vaginal mucosa was reapproximated with  2-0 Vicryl in a running locked fashion.  Cystoscopy was performed, revealing bilateral ureteral jets.  The instruments were removed from the patient's vagina and the Foley was reinserted.  Gloves and gown were changed.  Attention was turned  to the  abdominal portion of the case.  A brief pelvic survey performed revealed hemostasis.  Pneumoperitoneum was evacuated.  The ports were closed with 4-0 Vicryl and Dermabond.  The patient tolerated the procedure well.  Sponge, lap  and needle counts were  correct x2 per the operating staff.   SHW D: 04/09/2021 9:40:54 am T: 04/09/2021 10:13:00 am  JOB: [id number]/ [id number]

## 2021-04-09 NOTE — Interval H&P Note (Signed)
History and Physical Interval Note:  04/09/2021 7:09 AM  Natalie Guzman  has presented today for surgery, with the diagnosis of uterine leiomyoma.  The various methods of treatment have been discussed with the patient and family. After consideration of risks, benefits and other options for treatment, the patient has consented to  Procedure(s): LAPAROSCOPIC ASSISTED VAGINAL HYSTERECTOMY WITH SALPINGECTOMY (Bilateral) CYSTOSCOPY (N/A) as a surgical intervention.  The patient's history has been reviewed, patient examined, no change in status, stable for surgery.  I have reviewed the patient's chart and labs.  Questions were answered to the patient's satisfaction.     Arika Mainer Bovard-Stuckert

## 2021-04-09 NOTE — Anesthesia Postprocedure Evaluation (Signed)
Anesthesia Post Note  Patient: Natalie Guzman  Procedure(s) Performed: LAPAROSCOPIC ASSISTED VAGINAL HYSTERECTOMY WITH SALPINGECTOMY (Bilateral) CYSTOSCOPY LYSIS OF ADHESION     Patient location during evaluation: PACU Anesthesia Type: General Level of consciousness: awake and alert Pain management: pain level controlled Vital Signs Assessment: post-procedure vital signs reviewed and stable Respiratory status: spontaneous breathing, nonlabored ventilation, respiratory function stable and patient connected to nasal cannula oxygen Cardiovascular status: blood pressure returned to baseline and stable Postop Assessment: no apparent nausea or vomiting Anesthetic complications: no   No notable events documented.  Last Vitals:  Vitals:   04/09/21 1059 04/09/21 1159  BP: 114/82 123/81  Pulse: 92 99  Resp: 16 17  Temp: 36.5 C 36.7 C  SpO2: 98% 99%    Last Pain:  Vitals:   04/09/21 1159  TempSrc:   PainSc: [place]

## 2021-04-09 NOTE — Discharge Instructions (Signed)

## 2021-04-09 NOTE — Discharge Summary (Signed)
Physician Discharge Summary  Patient ID: Natalie Guzman MRN: [id number] DOB/AGE: 1975/10/14 46 y.o.  Admit date: 04/09/2021 Discharge date: 04/09/2021  Admission Diagnoses:submucosal fibroid  Discharge Diagnoses:  Principal Problem:   S/P laparoscopic assisted vaginal hysterectomy (LAVH)   Discharged Condition: good  Hospital Course: Admitted for LAVH/BS/cysto/LOA am 2/6 underwent surgery without difficulty or complication.  Desired early discharge to home - PM POD#0 ambulating, voiding, tolerating po, and pain controlled.  Labs stable.  D/C to home with precautions and contact info given  Consults: None  Significant Diagnostic Studies: labs: CBC, BMP  Treatments: IV hydration and surgery: LAVH/BS/cysto/LOA  Discharge Exam: Blood pressure 124/67, pulse (!) 104, temperature 98 F (36.7 C), resp. rate 16, height 4\' 11"  (1.499 m), weight 67.8 kg, last menstrual period 03/31/2021, SpO2 100 %. General appearance: alert and no distress Resp: clear to auscultation bilaterally Cardio: regular rate and rhythm GI: soft, non-tender; bowel sounds normal; no masses,  no organomegaly Extremities: extremities normal, atraumatic, no cyanosis or edema Incision/Wound: C/D/I  Disposition: Discharge disposition: 01-Home or Self Care       Discharge Instructions     Call MD for:  persistant nausea and vomiting   Complete by: As directed    Call MD for:  redness, tenderness, or signs of infection (pain, swelling, redness, odor or green/yellow discharge around incision site)   Complete by: As directed    Call MD for:  severe uncontrolled pain   Complete by: As directed    Diet - low sodium heart healthy   Complete by: As directed    Discharge instructions   Complete by: As directed    Call [phone] with questions or problems (call Dr Melba Coon [phone] if after hours)   Driving Restrictions   Complete by: As directed    While taking strong pain medicine   Increase activity slowly    Complete by: As directed    Lifting restrictions   Complete by: As directed    No heavy lifting - greater than 10-15lbs for 6 weeks   May walk up steps   Complete by: As directed    No wound care   Complete by: As directed    The dermabond (glue) will peel, you might see suture at the incision   Sexual Activity Restrictions   Complete by: As directed    Pelvic rest - no douching, tampons or sex for 6weeks   Walker    Complete by: As directed       Allergies as of 04/09/2021       Reactions   Ibuprofen Shortness Of Breath   Avocado Itching, Swelling        Medication List     TAKE these medications    amLODipine 10 MG tablet Commonly known as: NORVASC Take 1 tablet (10 mg total) by mouth daily.   b complex-C-folic acid 1 MG capsule Take 1 capsule by mouth daily.   FLUoxetine 40 MG capsule Commonly known as: PROZAC Take 1 capsule (40 mg total) by mouth daily.   hydrochlorothiazide 25 MG tablet Commonly known as: HYDRODIURIL Take 1 tablet by mouth once daily   oxyCODONE-acetaminophen 5-325 MG tablet Commonly known as: PERCOCET/ROXICET Take 1-2 tablets by mouth every 6 (six) hours as needed for severe pain (moderate to severe pain (when tolerating fluids)).   promethazine 12.5 MG tablet Commonly known as: PHENERGAN Take 1 tablet (12.5 mg total) by mouth every 6 (six) hours as needed for nausea or vomiting.   Vitamin D 50  MCG (2000 UT) Caps Take 1 capsule by mouth daily.        Follow-up Information     Bovard-Stuckert, Ashvik Grundman, MD. Schedule an appointment as soon as possible for a visit.   Specialty: Obstetrics and Gynecology Why: 2weeks postoperative and 6 weeks postoperative. Contact information: [address] [place] [address] [phone]                 Signed: Janyth Contes 04/09/2021, 4:58 PM

## 2021-04-09 NOTE — Progress Notes (Signed)
Day of Surgery Procedure(s) (LRB): LAPAROSCOPIC ASSISTED VAGINAL HYSTERECTOMY WITH SALPINGECTOMY (Bilateral) CYSTOSCOPY (N/A) LYSIS OF ADHESION  Subjective: Patient reports nausea, vomiting, and no problems voiding.  Feels N/V due to anesthesia,  Pain controlled Postop labs pending  Objective: I have reviewed patient's vital signs, intake and output, medications, and labs.  General: alert and no distress Resp: clear to auscultation bilaterally Cardio: regular rate and rhythm GI: soft, non-tender; bowel sounds normal; no masses,  no organomegaly Extremities: extremities normal, atraumatic, no cyanosis or edema Inc: C/D/I  Assessment: s/p Procedure(s): LAPAROSCOPIC ASSISTED VAGINAL HYSTERECTOMY WITH SALPINGECTOMY (Bilateral) CYSTOSCOPY (N/A) LYSIS OF ADHESION: stable and progressing well  Plan: Advance diet Encourage ambulation Advance to PO medication Discharge home if labs are stable, tolerates po meds.   D/c with percocet, phenergan per pt request, f/u 2 and 6 weeks  LOS: 0 days    Natalie Guzman 04/09/2021, 4:53 PM

## 2021-04-10 ENCOUNTER — Encounter (HOSPITAL_BASED_OUTPATIENT_CLINIC_OR_DEPARTMENT_OTHER): Payer: Self-pay | Admitting: Obstetrics and Gynecology

## 2021-04-10 LAB — SURGICAL PATHOLOGY

## 2021-04-23 ENCOUNTER — Encounter: Payer: Self-pay | Admitting: Physician Assistant

## 2021-05-02 ENCOUNTER — Other Ambulatory Visit (HOSPITAL_COMMUNITY): Payer: Self-pay

## 2021-05-03 ENCOUNTER — Other Ambulatory Visit (HOSPITAL_COMMUNITY): Payer: Self-pay

## 2021-05-03 MED ORDER — FLUOXETINE HCL 40 MG PO CAPS
40.0000 mg | ORAL_CAPSULE | ORAL | 5 refills | Status: DC
  Filled 2021-05-03: qty 90, 90d supply, fill #0
  Filled 2021-07-31: qty 90, 90d supply, fill #1
  Filled 2021-10-29: qty 90, 90d supply, fill #2

## 2021-05-03 MED FILL — Hydrochlorothiazide Tab 25 MG: ORAL | 90 days supply | Qty: 90 | Fill #0 | Status: CN

## 2021-05-03 MED FILL — Amlodipine Besylate Tab 10 MG (Base Equivalent): ORAL | 90 days supply | Qty: 90 | Fill #0 | Status: CN

## 2021-05-31 MED FILL — Amlodipine Besylate Tab 10 MG (Base Equivalent): ORAL | 90 days supply | Qty: 90 | Fill #0 | Status: AC

## 2021-06-01 ENCOUNTER — Other Ambulatory Visit (HOSPITAL_COMMUNITY): Payer: Self-pay

## 2021-07-31 ENCOUNTER — Other Ambulatory Visit (HOSPITAL_COMMUNITY): Payer: Self-pay

## 2021-08-26 MED FILL — Amlodipine Besylate Tab 10 MG (Base Equivalent): ORAL | 90 days supply | Qty: 90 | Fill #1 | Status: AC

## 2021-08-27 ENCOUNTER — Other Ambulatory Visit (HOSPITAL_COMMUNITY): Payer: Self-pay

## 2021-11-01 ENCOUNTER — Other Ambulatory Visit (HOSPITAL_COMMUNITY): Payer: Self-pay

## 2021-11-26 ENCOUNTER — Encounter: Payer: Self-pay | Admitting: *Deleted

## 2021-11-29 ENCOUNTER — Encounter: Payer: Self-pay | Admitting: Physician Assistant

## 2021-11-29 ENCOUNTER — Ambulatory Visit (INDEPENDENT_AMBULATORY_CARE_PROVIDER_SITE_OTHER): Payer: Self-pay | Admitting: Physician Assistant

## 2021-11-29 ENCOUNTER — Other Ambulatory Visit (HOSPITAL_COMMUNITY): Payer: Self-pay

## 2021-11-29 VITALS — BP 126/84 | HR 95 | Temp 97.7°F | Ht 59.0 in | Wt 152.6 lb

## 2021-11-29 DIAGNOSIS — F32A Depression, unspecified: Secondary | ICD-10-CM

## 2021-11-29 DIAGNOSIS — F419 Anxiety disorder, unspecified: Secondary | ICD-10-CM

## 2021-11-29 DIAGNOSIS — N951 Menopausal and female climacteric states: Secondary | ICD-10-CM

## 2021-11-29 DIAGNOSIS — I10 Essential (primary) hypertension: Secondary | ICD-10-CM

## 2021-11-29 DIAGNOSIS — Z0000 Encounter for general adult medical examination without abnormal findings: Secondary | ICD-10-CM

## 2021-11-29 DIAGNOSIS — Z1211 Encounter for screening for malignant neoplasm of colon: Secondary | ICD-10-CM

## 2021-11-29 LAB — COMPREHENSIVE METABOLIC PANEL
ALT: 9 U/L (ref 0–35)
AST: 12 U/L (ref 0–37)
Albumin: 4.2 g/dL (ref 3.5–5.2)
Alkaline Phosphatase: 83 U/L (ref 39–117)
BUN: 16 mg/dL (ref 6–23)
CO2: 28 mEq/L (ref 19–32)
Calcium: 9.7 mg/dL (ref 8.4–10.5)
Chloride: 102 mEq/L (ref 96–112)
Creatinine, Ser: 0.75 mg/dL (ref 0.40–1.20)
GFR: 96.36 mL/min (ref >60.00)
Glucose, Bld: 102 mg/dL — ABNORMAL HIGH (ref 70–99)
Potassium: 3.7 mEq/L (ref 3.5–5.1)
Sodium: 137 mEq/L (ref 135–145)
Total Bilirubin: 0.3 mg/dL (ref 0.2–1.2)
Total Protein: 7.6 g/dL (ref 6.0–8.3)

## 2021-11-29 LAB — CBC WITH DIFFERENTIAL/PLATELET
Basophils Absolute: 0 10*3/uL (ref 0.0–0.1)
Basophils Relative: 0.7 % (ref 0.0–3.0)
Eosinophils Absolute: 0.1 10*3/uL (ref 0.0–0.7)
Eosinophils Relative: 0.9 % (ref 0.0–5.0)
HCT: 38.2 % (ref 36.0–46.0)
Hemoglobin: 12.5 g/dL (ref 12.0–15.0)
Lymphocytes Relative: 25.2 % (ref 12.0–46.0)
Lymphs Abs: 1.4 10*3/uL (ref 0.7–4.0)
MCHC: 32.8 g/dL (ref 30.0–36.0)
MCV: 85.1 fl (ref 78.0–100.0)
Monocytes Absolute: 0.6 10*3/uL (ref 0.1–1.0)
Monocytes Relative: 10.8 % (ref 3.0–12.0)
Neutro Abs: 3.6 10*3/uL (ref 1.4–7.7)
Neutrophils Relative %: 62.4 % (ref 43.0–77.0)
Platelets: 336 10*3/uL (ref 150.0–400.0)
RBC: 4.48 Mil/uL (ref 3.87–5.11)
RDW: 14.4 % (ref 11.5–15.5)
WBC: 5.7 10*3/uL (ref 4.0–10.5)

## 2021-11-29 LAB — LIPID PANEL
Cholesterol: 166 mg/dL (ref 0–200)
HDL: 51.9 mg/dL (ref >39.00)
LDL Cholesterol: 100 mg/dL — ABNORMAL HIGH (ref 0–99)
NonHDL: 113.72
Total CHOL/HDL Ratio: 3
Triglycerides: 67 mg/dL (ref 0.0–149.0)
VLDL: 13.4 mg/dL (ref 0.0–40.0)

## 2021-11-29 MED ORDER — HYDROXYZINE HCL 10 MG PO TABS
10.0000 mg | ORAL_TABLET | Freq: Three times a day (TID) | ORAL | 0 refills | Status: AC | PRN
  Filled 2021-11-29: qty 30, 10d supply, fill #0

## 2021-11-29 MED ORDER — FLUOXETINE HCL 40 MG PO CAPS
40.0000 mg | ORAL_CAPSULE | Freq: Every day | ORAL | 3 refills | Status: AC
  Filled 2021-11-29 – 2022-02-01 (×3): qty 90, 90d supply, fill #0
  Filled 2022-04-28: qty 90, 90d supply, fill #1
  Filled 2022-07-25: qty 90, 90d supply, fill #2

## 2021-11-29 MED ORDER — FLUOXETINE HCL 20 MG PO TABS
20.0000 mg | ORAL_TABLET | Freq: Every day | ORAL | 0 refills | Status: DC
  Filled 2021-11-29: qty 30, 30d supply, fill #0

## 2021-11-29 NOTE — Progress Notes (Signed)
Subjective:    Patient ID: Natalie Guzman, female    DOB: Nov 23, 1975, 46 y.o.   MRN: [id number]  Chief Complaint  Patient presents with   Annual Exam    Pt in for annual CPE and fasting labs; pt wants to make sure it is still ok to be off fluid pill; anxiety is still high since hysterectomy and going back to work would like to discuss more; pt is working out more and all and all doing well.     HPI Patient is in today for annual CPE, fasting labs. Interim hx -patient had a laparoscopic vaginal hysterectomy with salpingectomy on 04/09/2021, states that she has healed well from this and is happy to no longer be in any pain.  Since this operation, she has had increased anxiety and hot flashes.  She is scheduled to see her gynecologist back later this year.  Patient states she is up-to-date with her mammogram.  She is working hard on diet and exercise, has been walking a lot.  She has been enjoying her new granddaughter who lives in [place].  Her daughter is set to graduate from [employer] and behavioral health as well.  She says a lot of good things going on, but still just not sleeping great and staying a bit keyed up.  Past Medical History:  Diagnosis Date   Allergic rhinitis    Anxiety    Breast cyst, right 2023   Pt states she was told the cyst was benign.   Depression    H/O abnormal cervical Papanicolaou smear    Hypertension    Mild persistent asthma    Uterine fibroid 2022    Past Surgical History:  Procedure Laterality Date   BREAST BIOPSY Right    around 2017   CERVICAL BIOPSY  W/ LOOP ELECTRODE EXCISION     when pt was in her 58's   CYSTOSCOPY N/A 04/09/2021   Procedure: CYSTOSCOPY;  Surgeon: Janyth Contes, MD;  Location: [facility];  Service: Gynecology;  Laterality: N/A;   LAPAROSCOPIC VAGINAL HYSTERECTOMY WITH SALPINGECTOMY Bilateral 04/09/2021   Procedure: LAPAROSCOPIC ASSISTED VAGINAL HYSTERECTOMY WITH SALPINGECTOMY;  Surgeon: Janyth Contes, MD;   Location: [facility];  Service: Gynecology;  Laterality: Bilateral;   LYSIS OF ADHESION  04/09/2021   Procedure: LYSIS OF ADHESION;  Surgeon: Janyth Contes, MD;  Location: [facility];  Service: Gynecology;;   TONSILLECTOMY  1992    Family History  Problem Relation Age of Onset   Hypertension Mother    Hypertension Father    Cerebral aneurysm Sister    Breast cancer Maternal Grandmother    Breast cancer Paternal Grandmother    Breast cancer Maternal Aunt    Breast cancer Paternal Aunt     Social History   Tobacco Use   Smoking status: Never   Smokeless tobacco: Never  Vaping Use   Vaping Use: Never used  Substance Use Topics   Alcohol use: Not Currently   Drug use: Yes    Types: Marijuana    Comment: pt states that she only uses edibles to control severe pain. 03/30/21     Allergies  Allergen Reactions   Ibuprofen Shortness Of Breath   Avocado Itching and Swelling    Review of Systems NEGATIVE UNLESS OTHERWISE INDICATED IN HPI      Objective:     BP 126/84 (BP Location: Right Arm)   Pulse 95   Temp 97.7 F (36.5 C) (Temporal)   Ht 4'  11" (1.499 m)   Wt 152 lb 9.6 oz (69.2 kg)   SpO2 98%   BMI 30.82 kg/m   Wt Readings from Last 3 Encounters:  11/29/21 152 lb 9.6 oz (69.2 kg)  04/09/21 149 lb 8 oz (67.8 kg)  04/05/21 152 lb (68.9 kg)    BP Readings from Last 3 Encounters:  11/29/21 126/84  04/09/21 (!) 132/92  04/05/21 120/84     Physical Exam Vitals and nursing note reviewed.  Constitutional:      Appearance: Normal appearance. She is normal weight. She is not toxic-appearing.  HENT:     Head: Normocephalic and atraumatic.     Right Ear: Tympanic membrane, ear canal and external ear normal.     Left Ear: Tympanic membrane, ear canal and external ear normal.     Nose: Nose normal.     Mouth/Throat:     Mouth: Mucous membranes are moist.  Eyes:     Extraocular Movements: Extraocular movements intact.      Conjunctiva/sclera: Conjunctivae normal.     Pupils: Pupils are equal, round, and reactive to light.  Cardiovascular:     Rate and Rhythm: Normal rate and regular rhythm.     Pulses: Normal pulses.     Heart sounds: Normal heart sounds.  Pulmonary:     Effort: Pulmonary effort is normal.     Breath sounds: Normal breath sounds.  Abdominal:     General: Abdomen is flat. Bowel sounds are normal.     Palpations: Abdomen is soft.  Musculoskeletal:        General: Normal range of motion.     Cervical back: Normal range of motion and neck supple.  Skin:    General: Skin is warm and dry.  Neurological:     General: No focal deficit present.     Mental Status: She is alert and oriented to person, place, and time.  Psychiatric:        Mood and Affect: Mood normal.        Behavior: Behavior normal.        Thought Content: Thought content normal.        Judgment: Judgment normal.        Assessment & Plan:  Encounter for annual physical exam Assessment & Plan: Age-appropriate screening and counseling performed today. Will check labs and call with results. Preventive measures discussed and printed in AVS for patient.   Cologuard this year  Mammo UTD  Patient Counseling: '[x]'$   Nutrition: Stressed importance of moderation in sodium/caffeine intake, saturated fat and cholesterol, caloric balance, sufficient intake of fresh fruits, vegetables, and fiber.  '[x]'$   Stressed the importance of regular exercise.   '[]'$   Substance Abuse: Discussed cessation/primary prevention of tobacco, alcohol, or other drug use; driving or other dangerous activities under the influence; availability of treatment for abuse.   '[]'$   Injury prevention: Discussed safety belts, safety helmets, smoke detector, smoking near bedding or upholstery.   '[]'$   Sexuality: Discussed sexually transmitted diseases, partner selection, use of condoms, avoidance of unintended pregnancy  and contraceptive alternatives.   '[x]'$   Dental  health: Discussed importance of regular tooth brushing, flossing, and dental visits.  '[x]'$   Health maintenance and immunizations reviewed. Please refer to Health maintenance section.      Orders: -     Cologuard -     CBC with Differential/Platelet -     Comprehensive metabolic panel -     Lipid panel  Anxiety and depression Assessment & Plan:  Worsening anxiety Inc prozac to 60 mg daily Trial hydroxyzine 10 mg prn  Pt aware of risks vs benefits and possible adverse reactions Pt to let me know in next few weeks how she is doing    Essential hypertension Assessment & Plan: Stable, to goal Cont Norvasc 10 mg daily  Orders: -     CBC with Differential/Platelet -     Comprehensive metabolic panel  Screening for colon cancer -     Cologuard  Menopausal flushing Assessment & Plan: Try black cohosh supplement Discuss with GYN   Other orders -     hydrOXYzine HCl; Take 1 tablet (10 mg total) by mouth 3 (three) times daily as needed for anxiety.  Dispense: 30 tablet; Refill: 0 -     FLUoxetine HCl; Take 1 tablet (20 mg total) by mouth daily. Take with the 40 mg to equal 60 mg daily.  Dispense: 30 tablet; Refill: 0 -     FLUoxetine HCl; Take 1 capsule (40 mg total) by mouth daily.  Dispense: 90 capsule; Refill: 3        Return in about 1 year (around 11/30/2022) for CPE.    Muhannad Bignell M Buckley Bradly, PA-C

## 2021-11-29 NOTE — Assessment & Plan Note (Signed)
Stable, to goal Cont Norvasc 10 mg daily

## 2021-11-29 NOTE — Assessment & Plan Note (Signed)
Age-appropriate screening and counseling performed today. Will check labs and call with results. Preventive measures discussed and printed in AVS for patient.   Cologuard this year  Mammo UTD  Patient Counseling: '[x]'$   Nutrition: Stressed importance of moderation in sodium/caffeine intake, saturated fat and cholesterol, caloric balance, sufficient intake of fresh fruits, vegetables, and fiber.  '[x]'$   Stressed the importance of regular exercise.   '[]'$   Substance Abuse: Discussed cessation/primary prevention of tobacco, alcohol, or other drug use; driving or other dangerous activities under the influence; availability of treatment for abuse.   '[]'$   Injury prevention: Discussed safety belts, safety helmets, smoke detector, smoking near bedding or upholstery.   '[]'$   Sexuality: Discussed sexually transmitted diseases, partner selection, use of condoms, avoidance of unintended pregnancy  and contraceptive alternatives.   '[x]'$   Dental health: Discussed importance of regular tooth brushing, flossing, and dental visits.  '[x]'$   Health maintenance and immunizations reviewed. Please refer to Health maintenance section.

## 2021-11-29 NOTE — Assessment & Plan Note (Signed)
Try black cohosh supplement Discuss with GYN

## 2021-11-29 NOTE — Patient Instructions (Addendum)
Very good to see you today! Congrats on new granddaughter - enjoy!!   Cologuard for cancer screening.  Please complete and send back to the mail once you receive this.  Labs today, treat pending any abnormal results.  Blood pressure looks great.  Keep working on lifestyle!  For anxiety -lets increase Prozac to 60 mg daily.  Reach out in the next few weeks and let me know if this is helping.  You can also try hydroxyzine 10 mg up to 3 times daily as needed for anxiety, caution with drowsy effect; may be best to take this just before bedtime.  For hot flashes, you can try black cohosh over-the-counter up to 3 times daily and this might help.  You can also talk to your gynecologist about any other options.  Happy holidays!!   Mc Bloodworth

## 2021-11-29 NOTE — Assessment & Plan Note (Signed)
Worsening anxiety Inc prozac to 60 mg daily Trial hydroxyzine 10 mg prn  Pt aware of risks vs benefits and possible adverse reactions Pt to let me know in next few weeks how she is doing

## 2021-12-06 ENCOUNTER — Other Ambulatory Visit: Payer: Self-pay | Admitting: Family

## 2021-12-06 DIAGNOSIS — I10 Essential (primary) hypertension: Secondary | ICD-10-CM

## 2021-12-07 ENCOUNTER — Other Ambulatory Visit: Payer: Self-pay | Admitting: Family

## 2021-12-07 DIAGNOSIS — I10 Essential (primary) hypertension: Secondary | ICD-10-CM

## 2021-12-07 NOTE — Telephone Encounter (Signed)
Unable to refill per protocol, provider is not at this practice. Will refuse.  Requested Prescriptions  Pending Prescriptions Disp Refills  . amLODipine (NORVASC) 10 MG tablet 90 tablet 2    Sig: Take 1 tablet (10 mg total) by mouth daily.     Cardiovascular: Calcium Channel Blockers 2 Failed - 12/07/2021  2:59 PM      Failed - Valid encounter within last 6 months    Recent Outpatient Visits          1 year ago Depression, unspecified depression type   Primary Care at [facility], Kriste Basque, NP   1 year ago Depression, unspecified depression type   Primary Care at [facility], Bayard Beaver, MD   2 years ago Essential hypertension   Primary Care at [facility], Bayard Beaver, MD   2 years ago Annual physical exam   Primary Care at [facility] Bayard Beaver, MD   3 years ago Essential hypertension   Primary Care at [facility], FNP      Future Appointments            In 12 months Allwardt, Randa Evens, PA-C [place], [place] BP in normal range    BP Readings from Last 1 Encounters:  11/29/21 126/84         Passed - Last Heart Rate in normal range    Pulse Readings from Last 1 Encounters:  11/29/21 95

## 2021-12-08 ENCOUNTER — Other Ambulatory Visit: Payer: Self-pay | Admitting: Family

## 2021-12-08 ENCOUNTER — Other Ambulatory Visit: Payer: Self-pay | Admitting: Physician Assistant

## 2021-12-08 ENCOUNTER — Other Ambulatory Visit (HOSPITAL_COMMUNITY): Payer: Self-pay

## 2021-12-08 ENCOUNTER — Telehealth: Payer: Self-pay | Admitting: Physician Assistant

## 2021-12-08 DIAGNOSIS — I10 Essential (primary) hypertension: Secondary | ICD-10-CM

## 2021-12-08 MED ORDER — AMLODIPINE BESYLATE 10 MG PO TABS
10.0000 mg | ORAL_TABLET | Freq: Every day | ORAL | 0 refills | Status: DC
  Filled 2021-12-08: qty 3, 3d supply, fill #0

## 2021-12-10 ENCOUNTER — Other Ambulatory Visit: Payer: Self-pay

## 2021-12-10 ENCOUNTER — Other Ambulatory Visit (HOSPITAL_COMMUNITY): Payer: Self-pay

## 2021-12-10 MED ORDER — AMLODIPINE BESYLATE 10 MG PO TABS
10.0000 mg | ORAL_TABLET | Freq: Every day | ORAL | 1 refills | Status: DC
  Filled 2021-12-10: qty 90, 90d supply, fill #0
  Filled 2022-03-13: qty 90, 90d supply, fill #1

## 2021-12-10 NOTE — Telephone Encounter (Signed)
Pt is needing a refill of her medication, amLODipine (NORVASC) 10 MG tablet. Pt is currently out of medication.   Patient Name: Natalie Guzman Gender: Female DOB: 1976-01-22 Age: 46 Y 68 M 16 D Return Phone Number: [phone] (Primary), [phone] (Secondary) Address: City/ State/ Zip: [place]  [address] Client [facility] Client Site [facility] [employer] Type Call Who Is Calling Patient / Member / Family / Caregiver Call Type Triage / Clinical Relationship To Patient Self Return Phone Number [phone] (Primary) Chief Complaint Medication Question (non symptomatic) Reason for Call Medication Question / Request Initial Comment Caller states that she would like to have medication refilled. RX Amlodipine 10 mg once daily. Caller states that she is out of medication and has been trying to get medication refilled all week. Translation No Nurse Assessment Nurse: Lucky Cowboy, RN, Levada Dy Date/Time (Eastern Time): 12/08/2021 10:39:32 AM Confirm and document reason for call. If symptomatic, describe symptoms. ---Caller stated that she needs refill on her Amlodipine. She has no s/s. Does the patient have any new or worsening symptoms? ---No Guidelines Guideline Title Affirmed Question Affirmed Notes Nurse Date/Time (Eastern Time) Blood Pressure - High Ran out of BP medications Dew, RN, Levada Dy 12/08/2021 10:40:00 AM Disp. Time Eilene Ghazi Time) Disposition Final User 12/08/2021 10:53:53 AM Call PCP within 24 Hours Yes Dew, RN, Levada Dy 12/08/2021 10:55:10 AM Pharmacy Call Lucky Cowboy, RN, Levada Dy Reason: Rx partial called in Final Disposition 12/08/2021 10:53:53 AM Call PCP within 24 Hours Yes Dew, RN, Marin Shutter Disagree/Comply Comply Caller Understands Yes PreDisposition Call Doctor Care Advice Given Per Guideline CALL PCP WITHIN 24 HOURS: CALL BACK IF: * You become worse CARE ADVICE given per High Blood  Pressure (Adult) guideline. Verbal Orders/Maintenance Medications Medication Refill Route Dosage Regime Duration Admin Instructions User Name Amlodipine 10 mg Yes Oral daily 3 Days last filled 08/28/21. Spoke with Ebony Hail at [facility] Ins no refills. Must call office for further meds. I confirmed that Alyssa Allwardt is a provider with the [place] location. Lucky Cowboy, RN, Levada Dy Comments User: Raford Pitcher, RN Date/Time Eilene Ghazi Time): 12/08/2021 10:56:01 AM I could not select pt's provider since she was not listed. 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 I have emailed client services to obtain an updated provider list in our system. Referrals REFERRED TO PCP OFFICE

## 2021-12-10 NOTE — Telephone Encounter (Signed)
New Rx for patient sent to patient pharmacy, last visit was CPE 11/27/21.

## 2021-12-29 ENCOUNTER — Other Ambulatory Visit: Payer: Self-pay | Admitting: Physician Assistant

## 2021-12-29 ENCOUNTER — Other Ambulatory Visit (HOSPITAL_COMMUNITY): Payer: Self-pay

## 2021-12-31 ENCOUNTER — Other Ambulatory Visit (HOSPITAL_COMMUNITY): Payer: Self-pay

## 2021-12-31 MED ORDER — FLUOXETINE HCL 20 MG PO TABS
20.0000 mg | ORAL_TABLET | Freq: Every day | ORAL | 0 refills | Status: DC
  Filled 2021-12-31: qty 90, 90d supply, fill #0

## 2022-02-01 ENCOUNTER — Other Ambulatory Visit (HOSPITAL_COMMUNITY): Payer: Self-pay

## 2022-02-14 ENCOUNTER — Encounter: Payer: Self-pay | Admitting: *Deleted

## 2022-02-27 NOTE — Progress Notes (Signed)
Noted  

## 2022-03-13 ENCOUNTER — Other Ambulatory Visit: Payer: Self-pay

## 2022-03-25 ENCOUNTER — Other Ambulatory Visit: Payer: Self-pay | Admitting: Physician Assistant

## 2022-03-25 ENCOUNTER — Other Ambulatory Visit (HOSPITAL_COMMUNITY): Payer: Self-pay

## 2022-03-25 MED ORDER — FLUOXETINE HCL 20 MG PO TABS
20.0000 mg | ORAL_TABLET | Freq: Every day | ORAL | 0 refills | Status: DC
  Filled 2022-03-25: qty 90, 90d supply, fill #0

## 2022-03-28 ENCOUNTER — Encounter: Payer: Self-pay | Admitting: Physician Assistant

## 2022-03-29 NOTE — Telephone Encounter (Signed)
See pt msg and advise

## 2022-03-30 ENCOUNTER — Other Ambulatory Visit (HOSPITAL_COMMUNITY): Payer: Self-pay

## 2022-04-03 ENCOUNTER — Ambulatory Visit: Payer: Self-pay | Admitting: Physician Assistant

## 2022-04-08 ENCOUNTER — Ambulatory Visit: Payer: BLUE CROSS/BLUE SHIELD | Admitting: Physician Assistant

## 2022-04-08 ENCOUNTER — Encounter: Payer: Self-pay | Admitting: Physician Assistant

## 2022-04-08 VITALS — BP 120/82 | HR 85 | Temp 98.4°F | Ht 59.0 in | Wt 147.6 lb

## 2022-04-08 DIAGNOSIS — N6311 Unspecified lump in the right breast, upper outer quadrant: Secondary | ICD-10-CM

## 2022-04-08 NOTE — Progress Notes (Signed)
Subjective:    Patient ID: Natalie Guzman, female    DOB: [birth year], 47 y.o.   MRN: [id number]  Chief Complaint  Patient presents with   Breast cyst    Pt in to discuss breast cyst; has already had imaging and just found out she is OON; since hysterectomy it flares up time to time; just got new insurance and now has to be seen through [facility];     HPI Patient is in today for recheck R breast mass / cyst. States she had it imaged previously and was told it was a cyst. Last few months seems larger, tends to really swell during what she presumes is ovulation. Hx hysterectomy. No pain or discharge. No color changes. No fever or chills or other symptoms.   Past Medical History:  Diagnosis Date   Allergic rhinitis    Anxiety    Breast cyst, right 2023   Pt states she was told the cyst was benign.   Depression    H/O abnormal cervical Papanicolaou smear    Hypertension    Mild persistent asthma    Uterine fibroid 2022    Past Surgical History:  Procedure Laterality Date   BREAST BIOPSY Right    around 2017   CERVICAL BIOPSY  W/ LOOP ELECTRODE EXCISION     when pt was in her 50's   CYSTOSCOPY N/A 04/09/2021   Procedure: CYSTOSCOPY;  Surgeon: Janyth Contes, MD;  Location: [place];  Service: Gynecology;  Laterality: N/A;   LAPAROSCOPIC VAGINAL HYSTERECTOMY WITH SALPINGECTOMY Bilateral 04/09/2021   Procedure: LAPAROSCOPIC ASSISTED VAGINAL HYSTERECTOMY WITH SALPINGECTOMY;  Surgeon: Janyth Contes, MD;  Location: [facility];  Service: Gynecology;  Laterality: Bilateral;   LYSIS OF ADHESION  04/09/2021   Procedure: LYSIS OF ADHESION;  Surgeon: Janyth Contes, MD;  Location: [place];  Service: Gynecology;;   TONSILLECTOMY  1992    Family History  Problem Relation Age of Onset   Hypertension Mother    Hypertension Father    Cerebral aneurysm Sister    Breast cancer Maternal Grandmother    Breast cancer Paternal  Grandmother    Breast cancer Maternal Aunt    Breast cancer Paternal Aunt     Social History   Tobacco Use   Smoking status: Never   Smokeless tobacco: Never  Vaping Use   Vaping Use: Never used  Substance Use Topics   Alcohol use: Not Currently   Drug use: Yes    Types: Marijuana    Comment: pt states that she only uses edibles to control severe pain. 03/30/21     Allergies  Allergen Reactions   Ibuprofen Shortness Of Breath   Avocado Itching and Swelling    Review of Systems NEGATIVE UNLESS OTHERWISE INDICATED IN HPI      Objective:     BP 120/82 (BP Location: Left Arm)   Pulse 85   Temp 98.4 F (36.9 C) (Temporal)   Ht '4\' 11"'$  (1.499 m)   Wt 147 lb 9.6 oz (67 kg)   LMP 03/31/2021 (Exact Date)   SpO2 95%   BMI 29.81 kg/m   Wt Readings from Last 3 Encounters:  04/08/22 147 lb 9.6 oz (67 kg)  11/29/21 152 lb 9.6 oz (69.2 kg)  04/09/21 149 lb 8 oz (67.8 kg)    BP Readings from Last 3 Encounters:  04/08/22 120/82  11/29/21 126/84  04/09/21 (!) 132/92     Physical Exam Vitals and nursing note reviewed. Exam  conducted with a chaperone present.  Constitutional:      Appearance: Normal appearance.  Cardiovascular:     Rate and Rhythm: Normal rate and regular rhythm.     Pulses: Normal pulses.  Pulmonary:     Effort: Pulmonary effort is normal.     Breath sounds: Normal breath sounds.  Chest:    Neurological:     Mental Status: She is alert.        Assessment & Plan:  Mass of upper outer quadrant of right breast    Encouraged patient to have new imaging done, due for annual mammogram anyway. Pt to contact her insurance and will reach back out where to send order for mammo / [place].  She will recheck sooner if any changes before then.     Return if symptoms worsen or fail to improve.    Morganne Haile M Rosaelena Kemnitz, PA-C

## 2022-04-08 NOTE — Patient Instructions (Signed)
Good to see you today! Please message me back with where you need imaging order sent to for insurance purposes.

## 2022-04-08 NOTE — Addendum Note (Signed)
Addended by: Theresa Duty on: 04/08/2022 03:02 PM   Modules accepted: Orders

## 2022-04-29 ENCOUNTER — Other Ambulatory Visit (HOSPITAL_COMMUNITY): Payer: Self-pay

## 2022-04-29 ENCOUNTER — Other Ambulatory Visit: Payer: Self-pay

## 2022-05-10 IMAGING — MG DIGITAL SCREENING BILAT W/ TOMO W/ CAD
8 series · 8 of 24 positions shown · non-contrast
Comparison: Previous exam(s).

CLINICAL DATA: Screening.

EXAM:
DIGITAL SCREENING BILATERAL MAMMOGRAM WITH TOMO AND CAD

[L CC synth-2D]
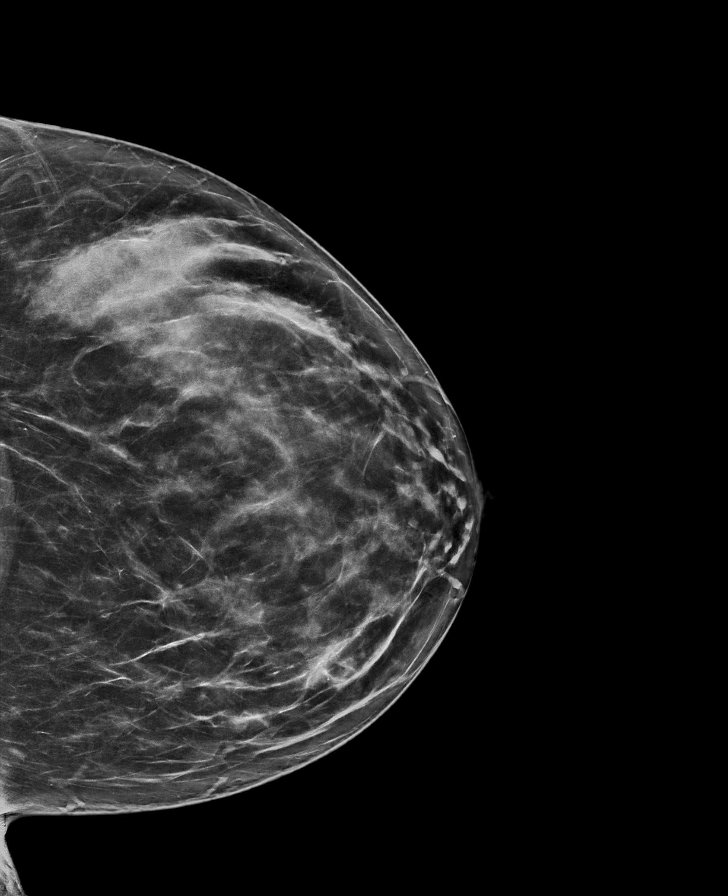

[R MLO synth-2D]
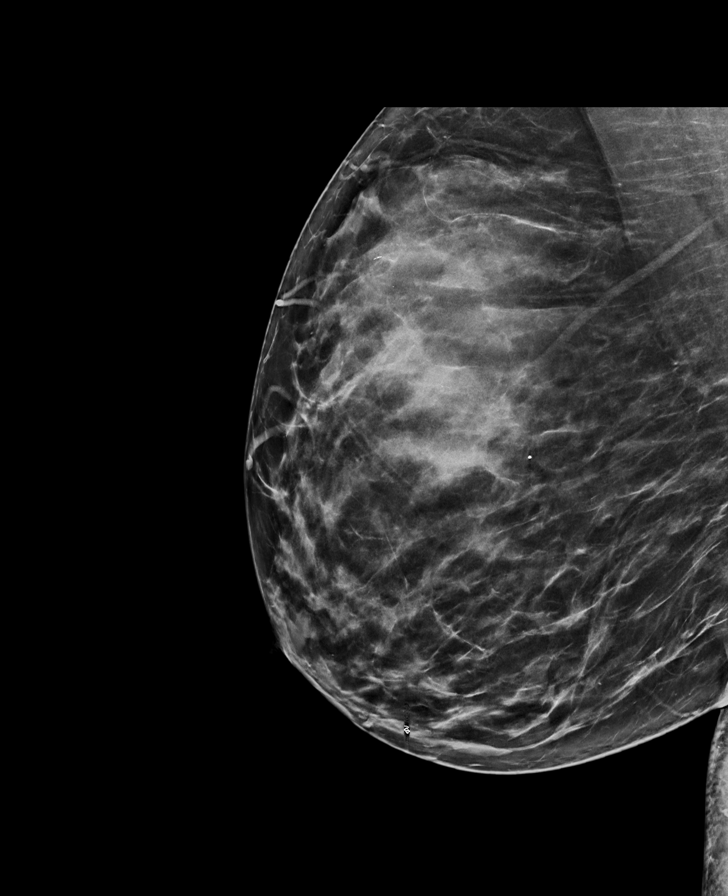

[R CC synth-2D]
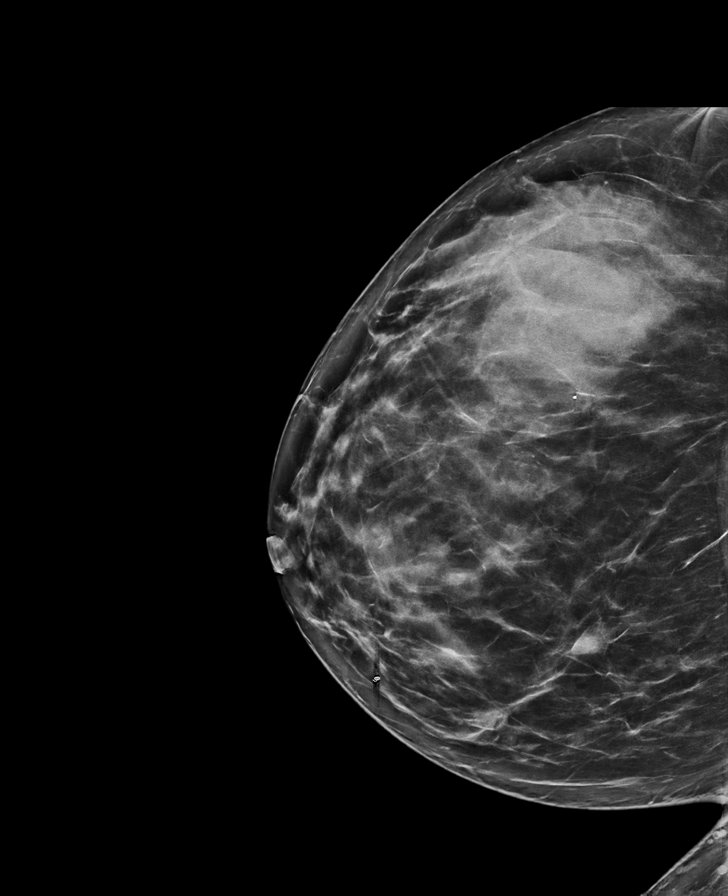

[L MLO synth-2D]
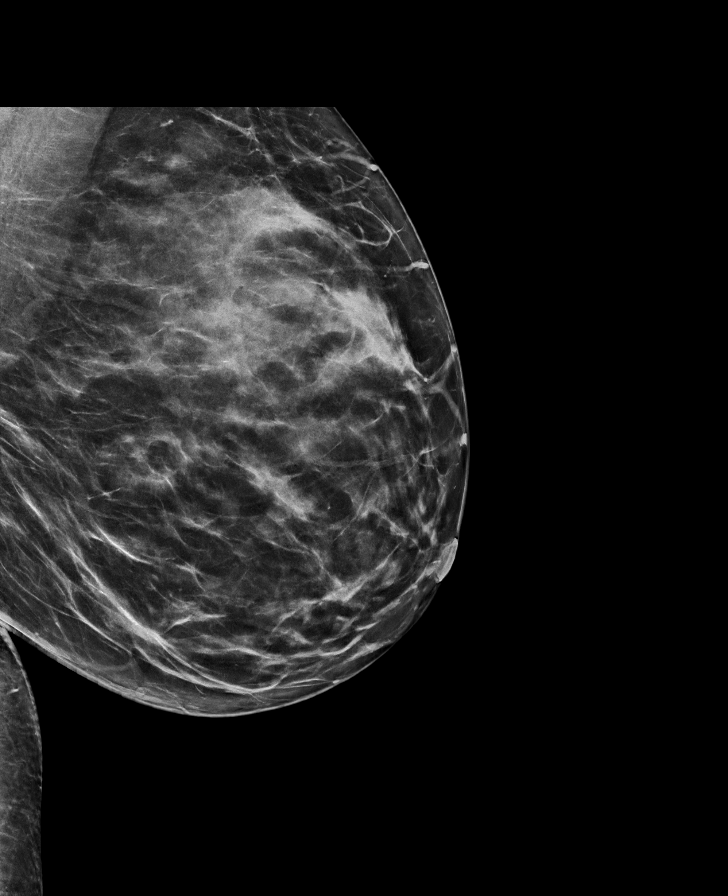

[R CC tomo · tomo slice 43/84.0]
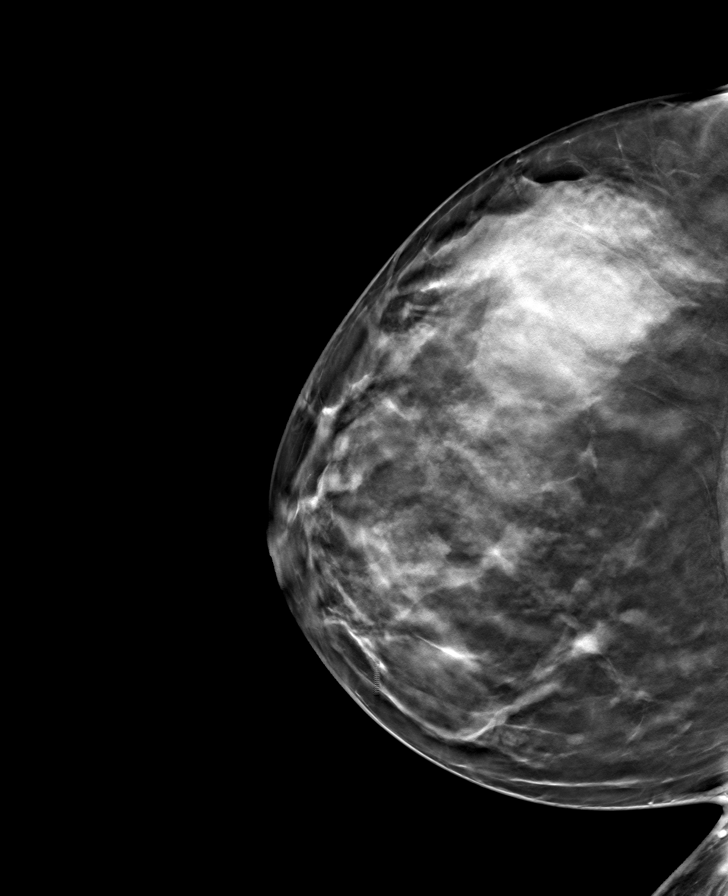

[L MLO tomo · tomo slice 40/79.0]
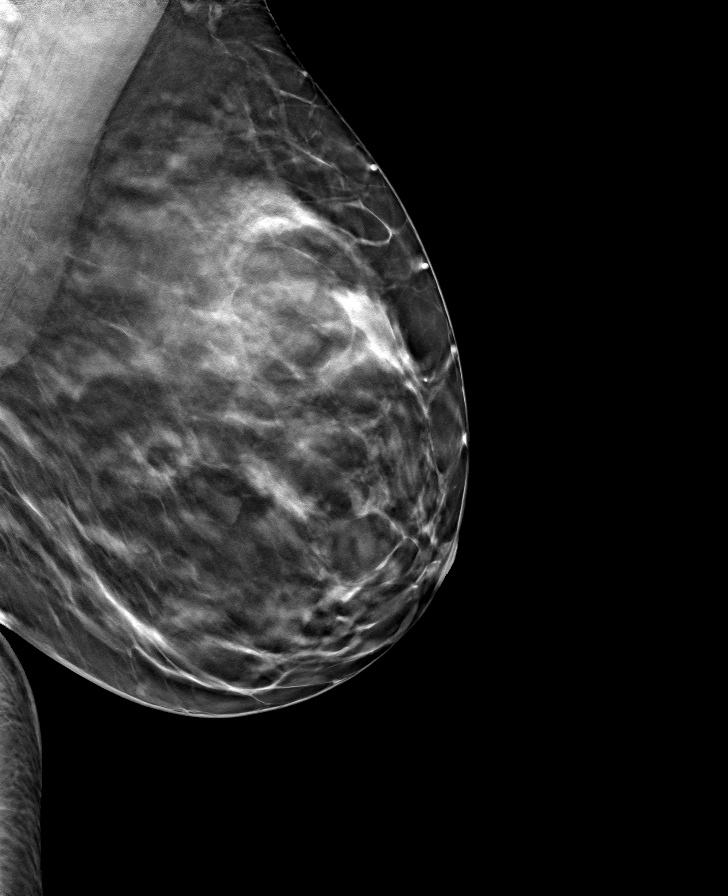

[L CC tomo · tomo slice 41/82.0]
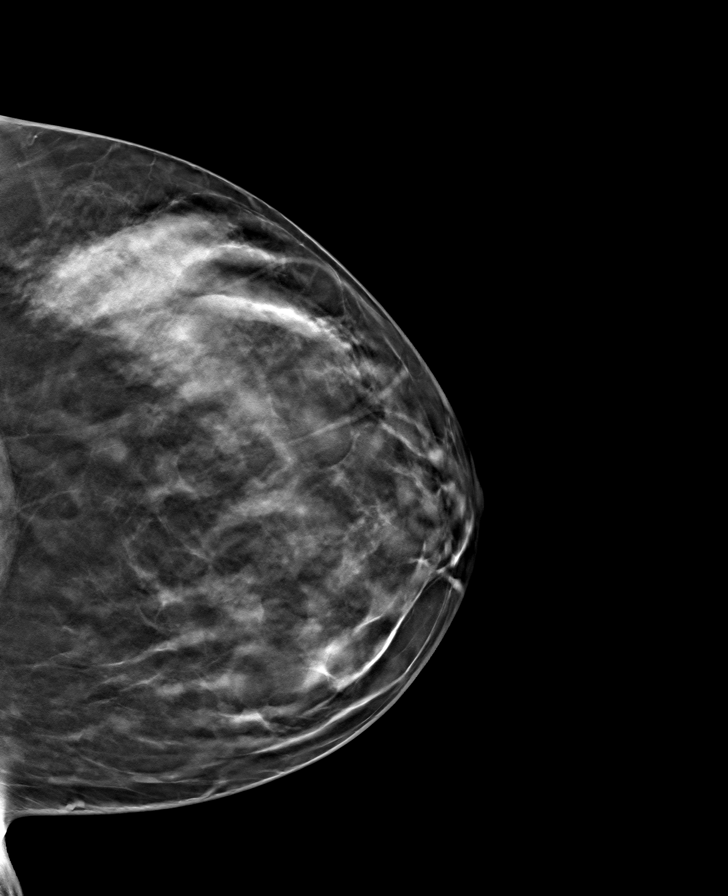

[R MLO tomo · tomo slice 41/81.0]
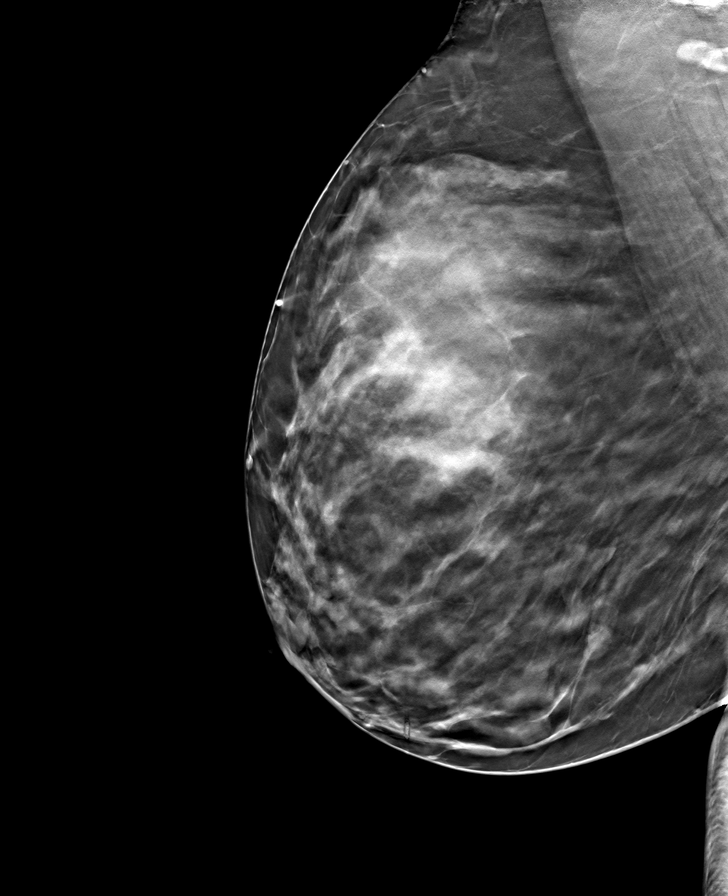

[8 of 24 positions shown; findings below may reference images not displayed]

ACR Breast Density Category c: The breast tissue is heterogeneously
dense, which may obscure small masses.
FINDINGS: There are no findings suspicious for malignancy. Images were
processed with CAD.
IMPRESSION: No mammographic evidence of malignancy. A result letter of this
screening mammogram will be mailed directly to the patient.

RECOMMENDATION:
Screening mammogram in one year. (Code:FT-U-LHB)

BI-RADS CATEGORY  1: Negative.

## 2022-06-09 ENCOUNTER — Other Ambulatory Visit: Payer: Self-pay | Admitting: Physician Assistant

## 2022-06-10 ENCOUNTER — Other Ambulatory Visit (HOSPITAL_COMMUNITY): Payer: Self-pay

## 2022-06-10 MED ORDER — AMLODIPINE BESYLATE 10 MG PO TABS
10.0000 mg | ORAL_TABLET | Freq: Every day | ORAL | 1 refills | Status: AC
  Filled 2022-06-10: qty 90, 90d supply, fill #0

## 2022-06-11 ENCOUNTER — Other Ambulatory Visit (HOSPITAL_COMMUNITY): Payer: Self-pay

## 2022-06-23 ENCOUNTER — Other Ambulatory Visit: Payer: Self-pay | Admitting: Physician Assistant

## 2022-06-24 ENCOUNTER — Other Ambulatory Visit (HOSPITAL_COMMUNITY): Payer: Self-pay

## 2022-06-24 MED ORDER — FLUOXETINE HCL 20 MG PO TABS
20.0000 mg | ORAL_TABLET | Freq: Every day | ORAL | 0 refills | Status: AC
  Filled 2022-06-24: qty 90, 90d supply, fill #0

## 2022-06-26 ENCOUNTER — Other Ambulatory Visit (HOSPITAL_COMMUNITY): Payer: Self-pay

## 2022-07-25 ENCOUNTER — Other Ambulatory Visit (HOSPITAL_COMMUNITY): Payer: Self-pay

## 2022-12-02 ENCOUNTER — Encounter: Payer: Self-pay | Admitting: Physician Assistant

## 2023-09-19 IMAGING — US US BREAST*R* LIMITED INC AXILLA
1 series · 6 of 6 positions shown · non-contrast
Comparison: Previous exam(s).

CLINICAL DATA: Screening recall for possible right breast mass.

EXAM:
ULTRASOUND OF THE RIGHT BREAST

[Series 1: us breast*right* limited inc axilla · 0.08mm/px · 6 of 6 slices shown]
[im 1/6]
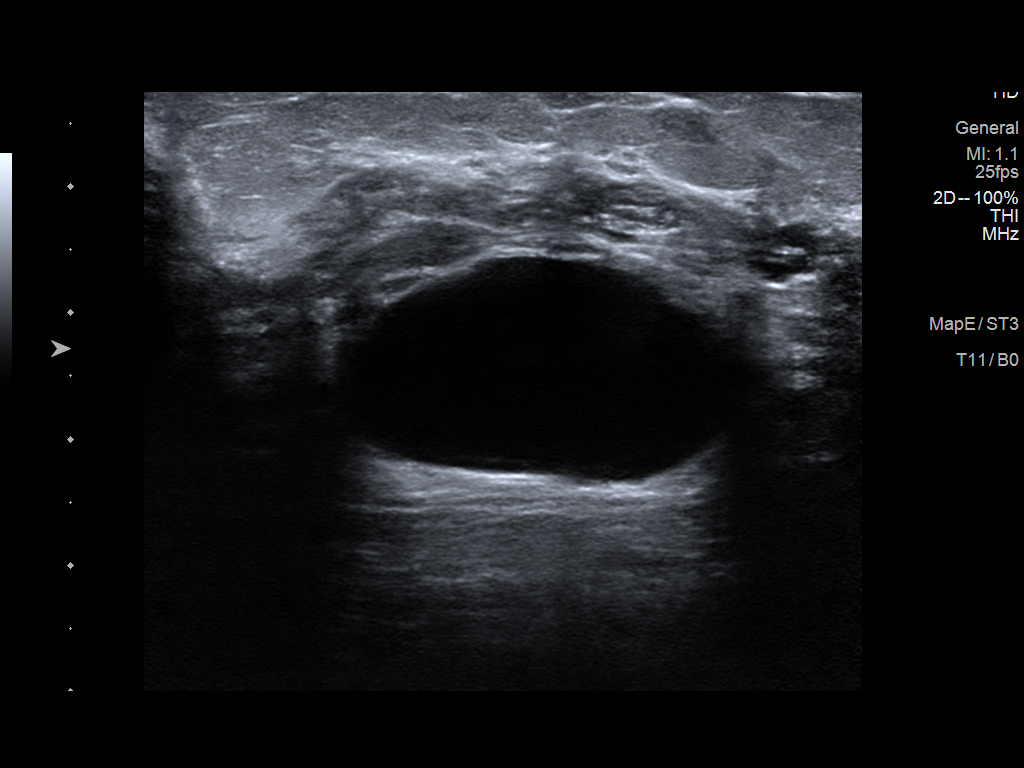
[im 2/6]
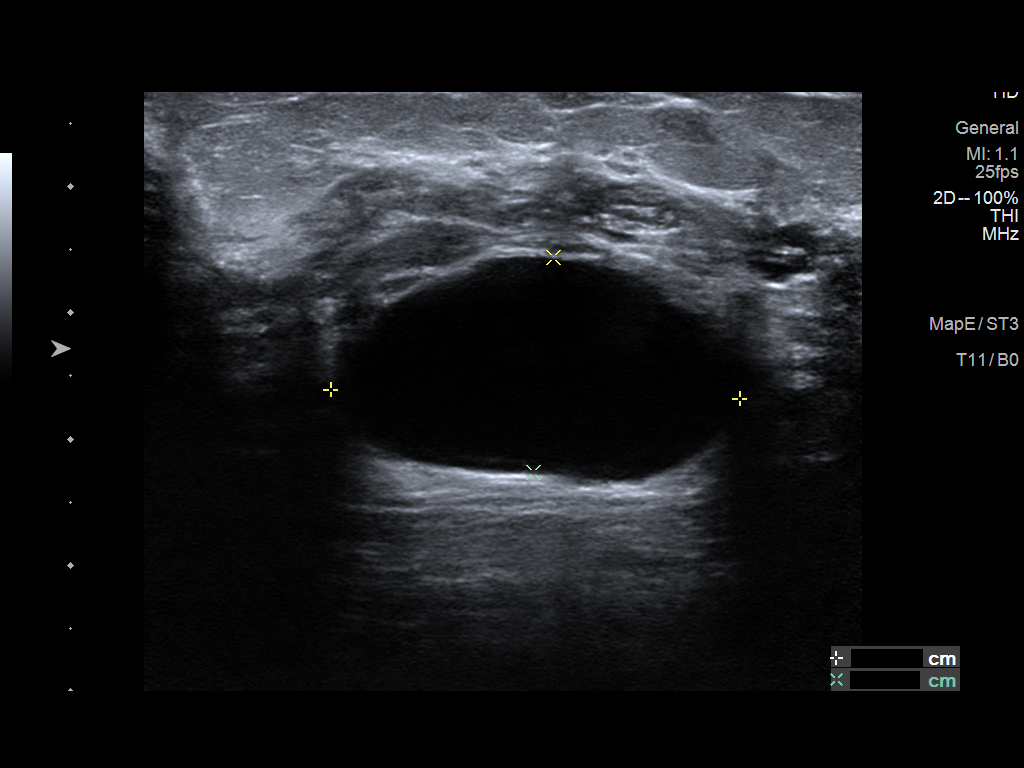
[im 3/6]
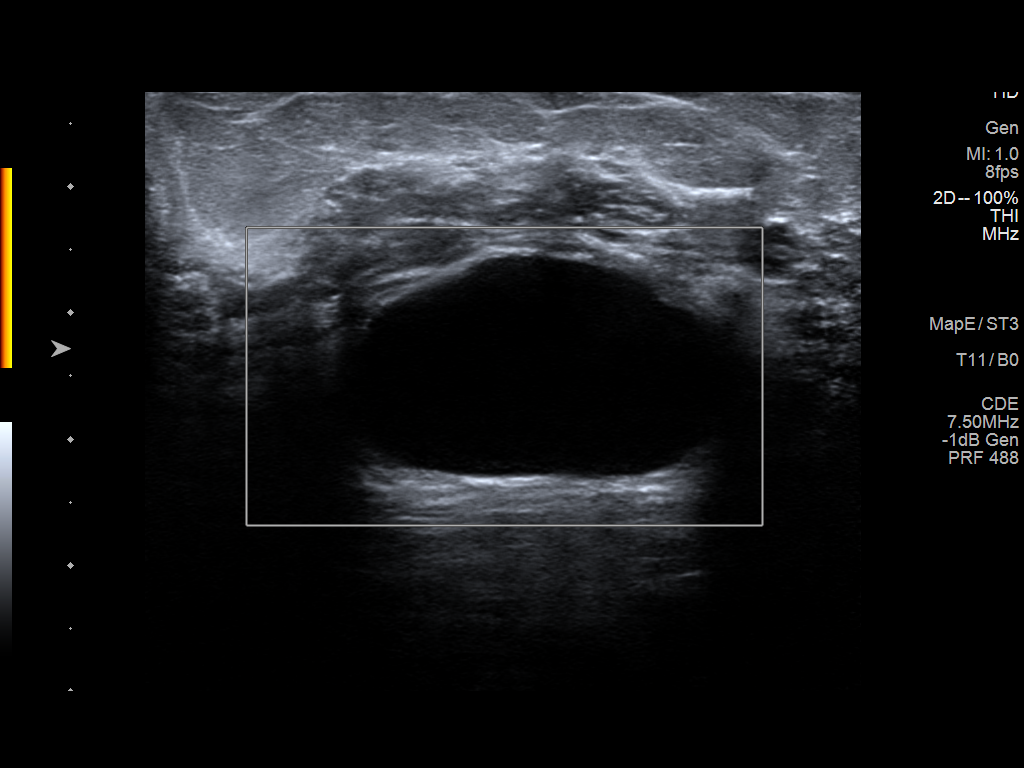
[im 4/6]
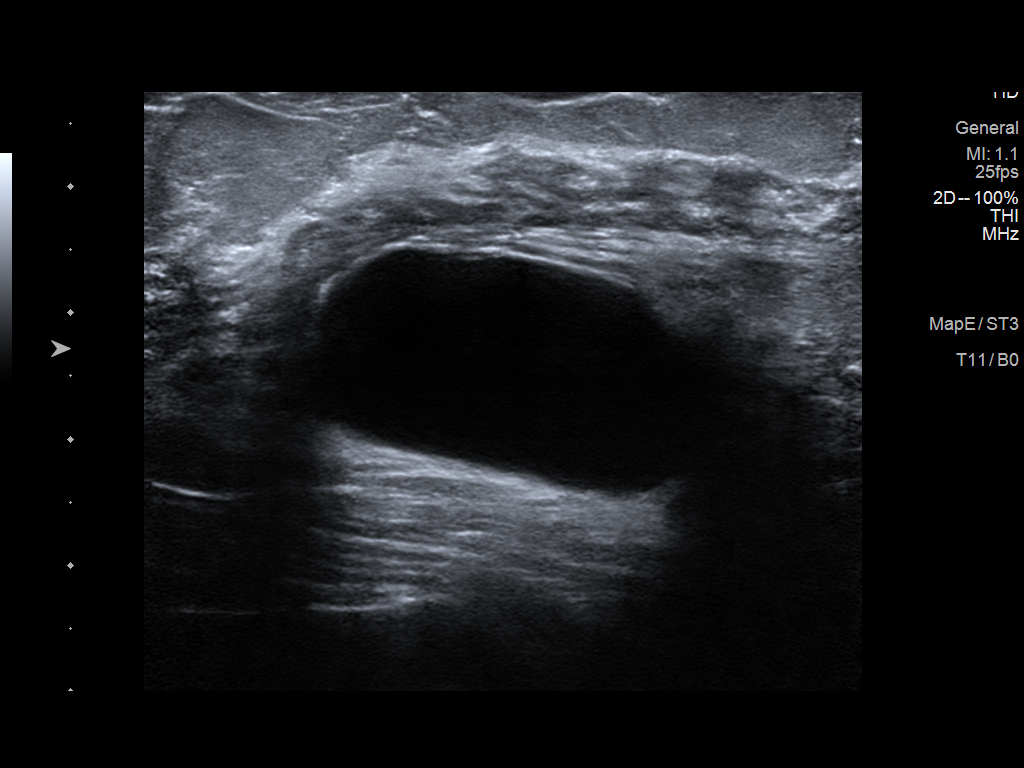
[im 5/6]
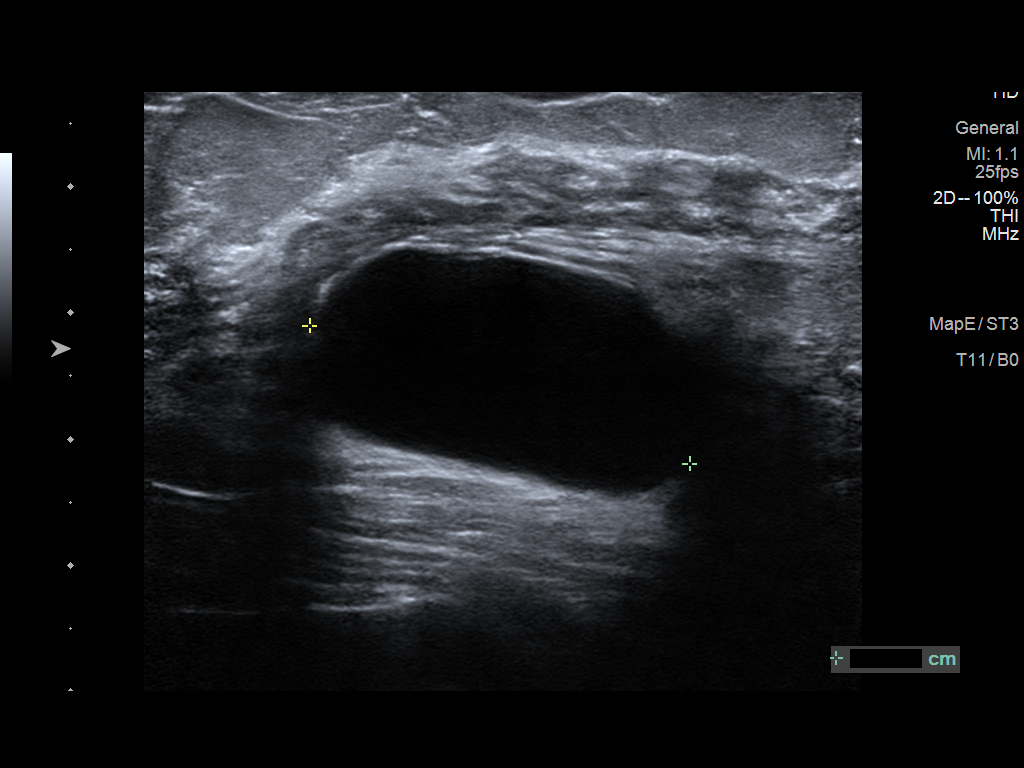
[im 6/6]
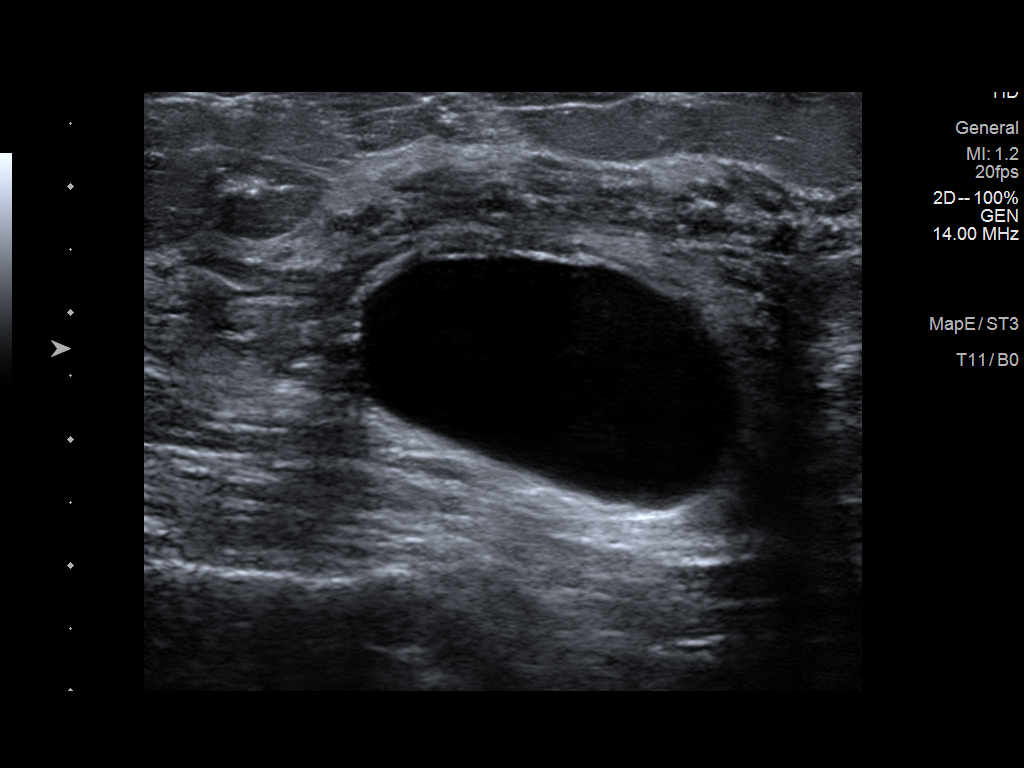

[6 of 6 positions shown; findings below may reference images not displayed]

FINDINGS: Targeted ultrasound is performed, showing a simple oval cyst in the
9:30 o'clock position of the right breast, posterior depth, 10 cm
the nipple, measuring 3.2 x 1.7 x 3.2 cm, consistent in size, shape
and location to the mammographic mass. There are no solid masses or
suspicious lesions.
IMPRESSION: 1. Benign right breast cyst.  No evidence of breast malignancy.

RECOMMENDATION:
Screening mammogram in one year.(Code:O5-P-OX4)

I have discussed the findings and recommendations with the patient.
If applicable, a reminder letter will be sent to the patient
regarding the next appointment.

BI-RADS CATEGORY  2: Benign.
